# Patient Record
Sex: Female | Born: 1940 | Race: White | Hispanic: No | Marital: Married | Smoking: Former smoker
Health system: Southern US, Community
[De-identification: ages and names within clinical notes are randomized; demographics above are authoritative.]

## PROBLEM LIST (undated history)

## (undated) DIAGNOSIS — L659 Nonscarring hair loss, unspecified: Secondary | ICD-10-CM

## (undated) DIAGNOSIS — F419 Anxiety disorder, unspecified: Secondary | ICD-10-CM

## (undated) DIAGNOSIS — F329 Major depressive disorder, single episode, unspecified: Secondary | ICD-10-CM

## (undated) DIAGNOSIS — H9190 Unspecified hearing loss, unspecified ear: Secondary | ICD-10-CM

## (undated) DIAGNOSIS — E785 Hyperlipidemia, unspecified: Secondary | ICD-10-CM

## (undated) DIAGNOSIS — I10 Essential (primary) hypertension: Secondary | ICD-10-CM

## (undated) DIAGNOSIS — J45909 Unspecified asthma, uncomplicated: Secondary | ICD-10-CM

## (undated) DIAGNOSIS — R251 Tremor, unspecified: Secondary | ICD-10-CM

## (undated) DIAGNOSIS — M109 Gout, unspecified: Secondary | ICD-10-CM

## (undated) DIAGNOSIS — D259 Leiomyoma of uterus, unspecified: Secondary | ICD-10-CM

## (undated) DIAGNOSIS — K5900 Constipation, unspecified: Secondary | ICD-10-CM

## (undated) DIAGNOSIS — K219 Gastro-esophageal reflux disease without esophagitis: Secondary | ICD-10-CM

## (undated) DIAGNOSIS — F32A Depression, unspecified: Secondary | ICD-10-CM

## (undated) DIAGNOSIS — N819 Female genital prolapse, unspecified: Secondary | ICD-10-CM

## (undated) HISTORY — PX: TUBAL LIGATION: SHX77

## (undated) HISTORY — DX: Female genital prolapse, unspecified: N81.9

## (undated) HISTORY — DX: Unspecified hearing loss, unspecified ear: H91.90

## (undated) HISTORY — DX: Leiomyoma of uterus, unspecified: D25.9

## (undated) HISTORY — DX: Tremor, unspecified: R25.1

## (undated) HISTORY — PX: TONSILLECTOMY: SUR1361

## (undated) HISTORY — DX: Gout, unspecified: M10.9

## (undated) HISTORY — DX: Hyperlipidemia, unspecified: E78.5

## (undated) HISTORY — DX: Constipation, unspecified: K59.00

## (undated) HISTORY — DX: Gastro-esophageal reflux disease without esophagitis: K21.9

## (undated) HISTORY — DX: Essential (primary) hypertension: I10

## (undated) HISTORY — DX: Nonscarring hair loss, unspecified: L65.9

## (undated) HISTORY — PX: WISDOM TOOTH EXTRACTION: SHX21

## (undated) HISTORY — DX: Anxiety disorder, unspecified: F41.9

---

## 1998-08-02 ENCOUNTER — Ambulatory Visit (HOSPITAL_COMMUNITY): Admission: RE | Admit: 1998-08-02 | Discharge: 1998-08-02 | Payer: Self-pay | Admitting: Gynecology

## 1999-08-05 ENCOUNTER — Other Ambulatory Visit: Admission: RE | Admit: 1999-08-05 | Discharge: 1999-08-05 | Payer: Self-pay | Admitting: Gynecology

## 2000-10-29 ENCOUNTER — Other Ambulatory Visit: Admission: RE | Admit: 2000-10-29 | Discharge: 2000-10-29 | Payer: Self-pay | Admitting: Gynecology

## 2002-01-13 ENCOUNTER — Other Ambulatory Visit: Admission: RE | Admit: 2002-01-13 | Discharge: 2002-01-13 | Payer: Self-pay | Admitting: Gynecology

## 2003-07-03 ENCOUNTER — Other Ambulatory Visit: Admission: RE | Admit: 2003-07-03 | Discharge: 2003-07-03 | Payer: Self-pay | Admitting: Gynecology

## 2004-07-08 ENCOUNTER — Other Ambulatory Visit: Admission: RE | Admit: 2004-07-08 | Discharge: 2004-07-08 | Payer: Self-pay | Admitting: Gynecology

## 2004-12-26 ENCOUNTER — Ambulatory Visit: Payer: Self-pay | Admitting: Gastroenterology

## 2005-01-03 ENCOUNTER — Ambulatory Visit: Payer: Self-pay | Admitting: Gastroenterology

## 2005-07-11 ENCOUNTER — Other Ambulatory Visit: Admission: RE | Admit: 2005-07-11 | Discharge: 2005-07-11 | Payer: Self-pay | Admitting: Gynecology

## 2007-02-01 ENCOUNTER — Other Ambulatory Visit: Admission: RE | Admit: 2007-02-01 | Discharge: 2007-02-01 | Payer: Self-pay | Admitting: Gynecology

## 2008-02-02 ENCOUNTER — Other Ambulatory Visit: Admission: RE | Admit: 2008-02-02 | Discharge: 2008-02-02 | Payer: Self-pay | Admitting: Gynecology

## 2010-02-28 DIAGNOSIS — G250 Essential tremor: Secondary | ICD-10-CM | POA: Insufficient documentation

## 2010-02-28 DIAGNOSIS — M810 Age-related osteoporosis without current pathological fracture: Secondary | ICD-10-CM | POA: Insufficient documentation

## 2010-02-28 DIAGNOSIS — K219 Gastro-esophageal reflux disease without esophagitis: Secondary | ICD-10-CM | POA: Insufficient documentation

## 2010-02-28 DIAGNOSIS — E785 Hyperlipidemia, unspecified: Secondary | ICD-10-CM | POA: Insufficient documentation

## 2010-02-28 DIAGNOSIS — F419 Anxiety disorder, unspecified: Secondary | ICD-10-CM | POA: Insufficient documentation

## 2010-02-28 DIAGNOSIS — J302 Other seasonal allergic rhinitis: Secondary | ICD-10-CM | POA: Insufficient documentation

## 2011-09-08 DIAGNOSIS — H9190 Unspecified hearing loss, unspecified ear: Secondary | ICD-10-CM | POA: Insufficient documentation

## 2012-09-23 DIAGNOSIS — R7301 Impaired fasting glucose: Secondary | ICD-10-CM | POA: Insufficient documentation

## 2015-10-05 DIAGNOSIS — M810 Age-related osteoporosis without current pathological fracture: Secondary | ICD-10-CM | POA: Diagnosis not present

## 2015-10-05 DIAGNOSIS — E784 Other hyperlipidemia: Secondary | ICD-10-CM | POA: Diagnosis not present

## 2015-10-05 DIAGNOSIS — E119 Type 2 diabetes mellitus without complications: Secondary | ICD-10-CM | POA: Diagnosis not present

## 2015-10-05 DIAGNOSIS — N390 Urinary tract infection, site not specified: Secondary | ICD-10-CM | POA: Diagnosis not present

## 2015-10-05 DIAGNOSIS — R8299 Other abnormal findings in urine: Secondary | ICD-10-CM | POA: Diagnosis not present

## 2015-10-10 DIAGNOSIS — H2512 Age-related nuclear cataract, left eye: Secondary | ICD-10-CM | POA: Diagnosis not present

## 2015-10-10 DIAGNOSIS — H25812 Combined forms of age-related cataract, left eye: Secondary | ICD-10-CM | POA: Diagnosis not present

## 2015-10-12 DIAGNOSIS — Z6824 Body mass index (BMI) 24.0-24.9, adult: Secondary | ICD-10-CM | POA: Diagnosis not present

## 2015-10-12 DIAGNOSIS — Z1389 Encounter for screening for other disorder: Secondary | ICD-10-CM | POA: Diagnosis not present

## 2015-10-12 DIAGNOSIS — D72829 Elevated white blood cell count, unspecified: Secondary | ICD-10-CM | POA: Diagnosis not present

## 2015-10-12 DIAGNOSIS — Z0000 Encounter for general adult medical examination without abnormal findings: Secondary | ICD-10-CM | POA: Diagnosis not present

## 2015-10-12 DIAGNOSIS — J45998 Other asthma: Secondary | ICD-10-CM | POA: Diagnosis not present

## 2015-10-12 DIAGNOSIS — M810 Age-related osteoporosis without current pathological fracture: Secondary | ICD-10-CM | POA: Diagnosis not present

## 2015-10-12 DIAGNOSIS — F418 Other specified anxiety disorders: Secondary | ICD-10-CM | POA: Diagnosis not present

## 2015-10-12 DIAGNOSIS — E784 Other hyperlipidemia: Secondary | ICD-10-CM | POA: Diagnosis not present

## 2015-10-12 DIAGNOSIS — E119 Type 2 diabetes mellitus without complications: Secondary | ICD-10-CM | POA: Diagnosis not present

## 2015-10-19 DIAGNOSIS — Z1212 Encounter for screening for malignant neoplasm of rectum: Secondary | ICD-10-CM | POA: Diagnosis not present

## 2016-01-04 ENCOUNTER — Encounter (HOSPITAL_COMMUNITY): Payer: Self-pay | Admitting: Emergency Medicine

## 2016-01-04 ENCOUNTER — Emergency Department (HOSPITAL_COMMUNITY): Payer: Medicare Other

## 2016-01-04 ENCOUNTER — Emergency Department (HOSPITAL_COMMUNITY)
Admission: EM | Admit: 2016-01-04 | Discharge: 2016-01-04 | Disposition: A | Discharge status: Home / Self Care | Payer: Medicare Other | Attending: Emergency Medicine | Admitting: Emergency Medicine

## 2016-01-04 DIAGNOSIS — Y9301 Activity, walking, marching and hiking: Secondary | ICD-10-CM | POA: Diagnosis not present

## 2016-01-04 DIAGNOSIS — Y999 Unspecified external cause status: Secondary | ICD-10-CM | POA: Diagnosis not present

## 2016-01-04 DIAGNOSIS — S99922A Unspecified injury of left foot, initial encounter: Secondary | ICD-10-CM | POA: Diagnosis not present

## 2016-01-04 DIAGNOSIS — S91312A Laceration without foreign body, left foot, initial encounter: Secondary | ICD-10-CM | POA: Diagnosis not present

## 2016-01-04 DIAGNOSIS — J45909 Unspecified asthma, uncomplicated: Secondary | ICD-10-CM | POA: Insufficient documentation

## 2016-01-04 DIAGNOSIS — IMO0002 Reserved for concepts with insufficient information to code with codable children: Secondary | ICD-10-CM

## 2016-01-04 DIAGNOSIS — S90812A Abrasion, left foot, initial encounter: Secondary | ICD-10-CM | POA: Diagnosis present

## 2016-01-04 DIAGNOSIS — W109XXA Fall (on) (from) unspecified stairs and steps, initial encounter: Secondary | ICD-10-CM | POA: Insufficient documentation

## 2016-01-04 DIAGNOSIS — Y929 Unspecified place or not applicable: Secondary | ICD-10-CM | POA: Diagnosis not present

## 2016-01-04 HISTORY — DX: Unspecified asthma, uncomplicated: J45.909

## 2016-01-04 MED ORDER — LIDOCAINE-EPINEPHRINE (PF) 2 %-1:200000 IJ SOLN
20.0000 mL | Freq: Once | INTRAMUSCULAR | Status: AC
  Administered 2016-01-04: 20 mL
  Filled 2016-01-04: qty 20

## 2016-01-04 MED ORDER — HYDROCODONE-ACETAMINOPHEN 5-325 MG PO TABS
0.5000 | ORAL_TABLET | Freq: Once | ORAL | Status: AC
  Administered 2016-01-04: 0.5 via ORAL
  Filled 2016-01-04: qty 1

## 2016-01-04 MED ORDER — LIDOCAINE-EPINEPHRINE-TETRACAINE (LET) SOLUTION
3.0000 mL | Freq: Once | NASAL | Status: AC
  Administered 2016-01-04: 3 mL via TOPICAL
  Filled 2016-01-04: qty 3

## 2016-01-04 MED ORDER — NAPROXEN 500 MG PO TABS: 500.0000 mg | ORAL_TABLET | Freq: Two times a day (BID) | ORAL | Status: DC

## 2016-01-04 NOTE — Discharge Instructions (Signed)
You'll need to have sutures removed in 7-10 days. Keep wound clean and dry. May wash with soap and water. Apply antibiotic ointment daily and change your dressing. Please be careful while walking at do not want your sutures to repair. Return to the ED if you experience severe worsening of her symptoms, redness or swelling around her wound, fevers, chills, additional skin tear.

## 2016-01-04 NOTE — ED Notes (Signed)
Per patient she was walking out the steps, twisting her foot and scraping it across the concrete creating a skin tear. Patient denies striking her head, states that she does not take blood thinners. Patient states she was able to step on it and walk after falling. Foot is currently wrapped in a wet towel from home.

## 2016-01-04 NOTE — ED Notes (Signed)
Xylocaine at bedside for suturing.

## 2016-01-04 NOTE — ED Provider Notes (Signed)
CSN: [id number]     Arrival date & time 01/04/16  1820 History   By signing my name below, I, Marisue Humble, attest that this documentation has been prepared under the direction and in the presence of non-physician practitioner, Gaylyn Rong PA-C. Electronically Signed: Marisue Humble, Scribe. 01/04/2016. 7:42 PM.   Chief Complaint  Patient presents with  . Fall    left foot injury   The history is provided by the patient. No language interpreter was used.   HPI Comments:  Mariah King is a 75 y.o. female who presents to the Emergency Department complaining of painful abrasion to the top of her left foot s/p fall ~1.5 hours ago. Pt states she was walking down stairs, twisted her left foot, and scraped it across concrete. Pt was able to ambulate without difficulty after the fall. No alleviating factors noted or treatments attempted PTA. Bleeding controlled PTA. Last Tetanus ~5 years ago. Pt denies head injury or syncope.  Past Medical History  Diagnosis Date  . Asthma    Past Surgical History  Procedure Laterality Date  . Tubal ligation    . Tonsillectomy     History reviewed. No pertinent family history. Social History  Substance Use Topics  . Smoking status: Never Smoker   . Smokeless tobacco: None  . Alcohol Use: No   OB History    No data available     Review of Systems  Skin: Positive for wound.  Neurological: Negative for syncope.  All other systems reviewed and are negative.  Allergies  Sulfa antibiotics  Home Medications   Prior to Admission medications   Not on File   BP 137/66 mmHg  Pulse 68  Temp(Src) 98 F (36.7 C) (Oral)  Resp 18  Ht  (1.626 m)  Wt 148 lb (67.132 kg)  BMI 25.39 kg/m2  SpO2 96% Physical Exam  Constitutional: She is oriented to person, place, and time. She appears well-developed and well-nourished. No distress.  HENT:  Head: Normocephalic and atraumatic.  Eyes: Conjunctivae are normal. Right eye exhibits no discharge.  Left eye exhibits no discharge. No scleral icterus.  Cardiovascular: Normal rate.   Pulmonary/Chest: Effort normal.  Musculoskeletal:  No decrease ROM of left ankle. No obvious bony deformity.  Neurological: She is alert and oriented to person, place, and time. Coordination normal.  Skin: Skin is warm and dry. No rash noted. She is not diaphoretic. No erythema. No pallor.  7cm laceration to dorsum of left foot with fascia layer exposure. No evidence of tendon injury. No foreign bodies seen or palpated.   Psychiatric: She has a normal mood and affect. Her behavior is normal.  Nursing note and vitals reviewed.   ED Course  Procedures  DIAGNOSTIC STUDIES:  Oxygen Saturation is 96% on RA, normal by my interpretation.    COORDINATION OF CARE:  7:39 PM Will order x-ray of left foot and administer pain medication. Discussed treatment plan with pt at bedside and pt agreed to plan.  LACERATION REPAIR Performed by: Gaylyn Rong PA-C Consent: Verbal consent obtained. Risks and benefits: risks, benefits and alternatives were discussed Patient identity confirmed: provided demographic data Time out performed prior to procedure Prepped and Draped in normal sterile fashion Wound explored Laceration Location: dorsum left foot Laceration Length: 7cm No Foreign Bodies seen or palpated Anesthesia: local infiltration Local anesthetic: lidocaine 2% epinephrine Anesthetic total: 10 ml Irrigation method: syringe Amount of cleaning: standard Skin closure: approximated Number of sutures or staples: 14 Technique: simple interrupted Patient  tolerance: Patient tolerated the procedure well with no immediate complications.   Labs Review Labs Reviewed - No data to display  Imaging Review Dg Foot Complete Left  01/04/2016  CLINICAL DATA:  Larey SeatFell 1-1/2 hours ago. EXAM: LEFT FOOT - COMPLETE 3+ VIEW COMPARISON:  None. FINDINGS: Negative for acute fracture, dislocation or radiopaque foreign body. No  acute soft tissue abnormality. IMPRESSION: Negative. Electronically Signed   By: Ellery Plunkaniel R Mitchell M.D.   On: 01/04/2016 20:05   I have personally reviewed and evaluated these images and lab results as part of my medical decision-making.   EKG Interpretation None      MDM   Final diagnoses:  Laceration   Tetanus UTD. Laceration occurred < 12 hours prior to repair. Discussed laceration care with pt and answered questions. Pt to f-u for suture removal in 7-10 days and wound check sooner should there be signs of dehiscence or infection. Pt is hemodynamically stable with no complaints prior to dc.      I personally performed the services described in this documentation, which was scribed in my presence. The recorded information has been reviewed and is accurate.     Lester KinsmanSamantha Tripp HardinDowless, PA-C 01/04/16 2148  Lorre NickAnthony Allen, MD 01/13/16 [phone]

## 2016-01-04 NOTE — ED Notes (Signed)
dermabond given to PA.

## 2016-01-04 NOTE — ED Notes (Signed)
PA at bedside for suturing 

## 2016-01-08 DIAGNOSIS — Z5189 Encounter for other specified aftercare: Secondary | ICD-10-CM | POA: Diagnosis not present

## 2016-01-08 DIAGNOSIS — S91312A Laceration without foreign body, left foot, initial encounter: Secondary | ICD-10-CM | POA: Diagnosis not present

## 2016-01-15 DIAGNOSIS — E119 Type 2 diabetes mellitus without complications: Secondary | ICD-10-CM | POA: Diagnosis not present

## 2016-01-15 DIAGNOSIS — Z6825 Body mass index (BMI) 25.0-25.9, adult: Secondary | ICD-10-CM | POA: Diagnosis not present

## 2016-01-15 DIAGNOSIS — Z4802 Encounter for removal of sutures: Secondary | ICD-10-CM | POA: Diagnosis not present

## 2016-01-15 DIAGNOSIS — Z5189 Encounter for other specified aftercare: Secondary | ICD-10-CM | POA: Diagnosis not present

## 2016-01-18 DIAGNOSIS — Z6824 Body mass index (BMI) 24.0-24.9, adult: Secondary | ICD-10-CM | POA: Diagnosis not present

## 2016-01-18 DIAGNOSIS — Z4802 Encounter for removal of sutures: Secondary | ICD-10-CM | POA: Diagnosis not present

## 2016-01-18 DIAGNOSIS — Z5189 Encounter for other specified aftercare: Secondary | ICD-10-CM | POA: Diagnosis not present

## 2016-01-31 DIAGNOSIS — L0889 Other specified local infections of the skin and subcutaneous tissue: Secondary | ICD-10-CM | POA: Diagnosis not present

## 2016-01-31 DIAGNOSIS — Z6824 Body mass index (BMI) 24.0-24.9, adult: Secondary | ICD-10-CM | POA: Diagnosis not present

## 2016-02-25 ENCOUNTER — Encounter (HOSPITAL_BASED_OUTPATIENT_CLINIC_OR_DEPARTMENT_OTHER): Payer: Medicare Other | Attending: Internal Medicine

## 2016-02-25 DIAGNOSIS — Z87891 Personal history of nicotine dependence: Secondary | ICD-10-CM | POA: Insufficient documentation

## 2016-02-25 DIAGNOSIS — L97521 Non-pressure chronic ulcer of other part of left foot limited to breakdown of skin: Secondary | ICD-10-CM | POA: Insufficient documentation

## 2016-02-25 DIAGNOSIS — E11621 Type 2 diabetes mellitus with foot ulcer: Secondary | ICD-10-CM | POA: Diagnosis not present

## 2016-02-25 DIAGNOSIS — S90812A Abrasion, left foot, initial encounter: Secondary | ICD-10-CM | POA: Diagnosis not present

## 2016-03-03 ENCOUNTER — Encounter (HOSPITAL_BASED_OUTPATIENT_CLINIC_OR_DEPARTMENT_OTHER): Payer: Medicare Other | Attending: Internal Medicine

## 2016-03-03 DIAGNOSIS — L97521 Non-pressure chronic ulcer of other part of left foot limited to breakdown of skin: Secondary | ICD-10-CM | POA: Diagnosis not present

## 2016-03-03 DIAGNOSIS — E11621 Type 2 diabetes mellitus with foot ulcer: Secondary | ICD-10-CM | POA: Insufficient documentation

## 2016-03-03 DIAGNOSIS — E1142 Type 2 diabetes mellitus with diabetic polyneuropathy: Secondary | ICD-10-CM | POA: Diagnosis not present

## 2016-03-10 DIAGNOSIS — E11621 Type 2 diabetes mellitus with foot ulcer: Secondary | ICD-10-CM | POA: Diagnosis not present

## 2016-03-10 DIAGNOSIS — L97521 Non-pressure chronic ulcer of other part of left foot limited to breakdown of skin: Secondary | ICD-10-CM | POA: Diagnosis not present

## 2016-03-17 DIAGNOSIS — E11621 Type 2 diabetes mellitus with foot ulcer: Secondary | ICD-10-CM | POA: Diagnosis not present

## 2016-03-17 DIAGNOSIS — L97521 Non-pressure chronic ulcer of other part of left foot limited to breakdown of skin: Secondary | ICD-10-CM | POA: Diagnosis not present

## 2016-03-24 DIAGNOSIS — L97521 Non-pressure chronic ulcer of other part of left foot limited to breakdown of skin: Secondary | ICD-10-CM | POA: Diagnosis not present

## 2016-03-24 DIAGNOSIS — E11621 Type 2 diabetes mellitus with foot ulcer: Secondary | ICD-10-CM | POA: Diagnosis not present

## 2016-03-31 DIAGNOSIS — L97521 Non-pressure chronic ulcer of other part of left foot limited to breakdown of skin: Secondary | ICD-10-CM | POA: Diagnosis not present

## 2016-03-31 DIAGNOSIS — E11621 Type 2 diabetes mellitus with foot ulcer: Secondary | ICD-10-CM | POA: Diagnosis not present

## 2016-04-07 ENCOUNTER — Encounter (HOSPITAL_BASED_OUTPATIENT_CLINIC_OR_DEPARTMENT_OTHER): Payer: Medicare Other | Attending: Internal Medicine

## 2016-04-07 DIAGNOSIS — E11621 Type 2 diabetes mellitus with foot ulcer: Secondary | ICD-10-CM | POA: Diagnosis not present

## 2016-04-07 DIAGNOSIS — L97821 Non-pressure chronic ulcer of other part of left lower leg limited to breakdown of skin: Secondary | ICD-10-CM | POA: Insufficient documentation

## 2016-04-07 DIAGNOSIS — X35XXXA Volcanic eruption, initial encounter: Secondary | ICD-10-CM | POA: Diagnosis not present

## 2016-05-15 DIAGNOSIS — H2511 Age-related nuclear cataract, right eye: Secondary | ICD-10-CM | POA: Diagnosis not present

## 2016-05-19 DIAGNOSIS — Z23 Encounter for immunization: Secondary | ICD-10-CM | POA: Diagnosis not present

## 2016-10-08 DIAGNOSIS — E119 Type 2 diabetes mellitus without complications: Secondary | ICD-10-CM | POA: Diagnosis not present

## 2016-10-08 DIAGNOSIS — D72829 Elevated white blood cell count, unspecified: Secondary | ICD-10-CM | POA: Diagnosis not present

## 2016-10-08 DIAGNOSIS — E784 Other hyperlipidemia: Secondary | ICD-10-CM | POA: Diagnosis not present

## 2016-10-08 DIAGNOSIS — M810 Age-related osteoporosis without current pathological fracture: Secondary | ICD-10-CM | POA: Diagnosis not present

## 2016-10-08 DIAGNOSIS — Z0000 Encounter for general adult medical examination without abnormal findings: Secondary | ICD-10-CM | POA: Diagnosis not present

## 2016-10-16 DIAGNOSIS — D72829 Elevated white blood cell count, unspecified: Secondary | ICD-10-CM | POA: Diagnosis not present

## 2016-10-16 DIAGNOSIS — Z0000 Encounter for general adult medical examination without abnormal findings: Secondary | ICD-10-CM | POA: Diagnosis not present

## 2016-10-16 DIAGNOSIS — J45998 Other asthma: Secondary | ICD-10-CM | POA: Diagnosis not present

## 2016-10-16 DIAGNOSIS — E119 Type 2 diabetes mellitus without complications: Secondary | ICD-10-CM | POA: Diagnosis not present

## 2016-10-21 DIAGNOSIS — Z1212 Encounter for screening for malignant neoplasm of rectum: Secondary | ICD-10-CM | POA: Diagnosis not present

## 2016-10-23 DIAGNOSIS — M810 Age-related osteoporosis without current pathological fracture: Secondary | ICD-10-CM | POA: Diagnosis not present

## 2016-12-18 ENCOUNTER — Ambulatory Visit (INDEPENDENT_AMBULATORY_CARE_PROVIDER_SITE_OTHER): Payer: Medicare Other | Admitting: Physician Assistant

## 2016-12-18 ENCOUNTER — Ambulatory Visit (INDEPENDENT_AMBULATORY_CARE_PROVIDER_SITE_OTHER): Payer: Medicare Other

## 2016-12-18 VITALS — BP 130/81 | HR 92 | Temp 97.4°F | Resp 18 | Ht 64.0 in | Wt 143.0 lb

## 2016-12-18 DIAGNOSIS — S81811A Laceration without foreign body, right lower leg, initial encounter: Secondary | ICD-10-CM | POA: Diagnosis not present

## 2016-12-18 DIAGNOSIS — M79661 Pain in right lower leg: Secondary | ICD-10-CM

## 2016-12-18 NOTE — Patient Instructions (Addendum)
WOUND CARE Please return in 7 days to have your wound reevaluated or sooner if you have concerns. Marland Kitchen Keep area clean and dry for 24 hours. Do not remove bandage, if applied. . After 24 hours, you can wash in the shower gently with mild soap and warm water. Reapply a new bandage after cleaning wound, if directed. . Continue daily cleansing with soap and water until Steri strips are removed . Do not apply any ointments or creams to the wound while steri strips are in place, as this may cause delayed healing. . Notify the office if you experience any of the following signs of infection: Swelling, redness, pus drainage, streaking, fever >101.0 F . Notify the office if you experience excessive bleeding that does not stop after 15-20 minutes of constant, firm pressure.     IF you received an x-ray today, you will receive an invoice from [facility]. Please contact [facility] at [phone] with questions or concerns regarding your invoice.   IF you received labwork today, you will receive an invoice from [facility]. Please contact [facility] at [phone] with questions or concerns regarding your invoice.   Our billing staff will not be able to assist you with questions regarding bills from these companies.  You will be contacted with the lab results as soon as they are available. The fastest way to get your results is to activate your My Chart account. Instructions are located on the last page of this paperwork. If you have not heard from [place] regarding the results in 2 weeks, please contact this office.

## 2016-12-18 NOTE — Progress Notes (Signed)
Mariah King  MRN: [id number] DOB: 1940-12-19  Subjective:  Mariah King is a 76 y.o. female seen in office today for a chief complaint of cut to right lower leg. Pt fell from a chair while trying to retireve her blender from a cabinet. She landed on her bottom. She manged to cut her lower leg with the fall and had immediately bleeding. This occurred an hour before visit and she notes she cannot get the bleeding to stop. She is also having some pain surround the cut. She is not on a blood thinner but does take aspirin daily. She is up to date on tetanus. Of note, pt denies hitting her head, LOC, nausea, vomiting, decreased ROM, numbness, tingling, and weakness.   Review of Systems  Constitutional: Negative for chills, diaphoresis and fever.  Musculoskeletal: Negative for back pain and gait problem.  Neurological: Negative for syncope and headaches.  Psychiatric/Behavioral: Negative for confusion and decreased concentration.   There are no active problems to display for this patient.   Current Outpatient Prescriptions on File Prior to Visit  Medication Sig Dispense Refill  . naproxen (NAPROSYN) 500 MG tablet Take 1 tablet (500 mg total) by mouth 2 (two) times daily. (Patient not taking: Reported on 12/18/2016) 30 tablet 0   No current facility-administered medications on file prior to visit.     Allergies  Allergen Reactions  . Sulfa Antibiotics Other (See Comments)    Childhood, unknown reaction     Objective:  BP 130/81   Pulse 92   Temp 97.4 F (36.3 C) (Oral)   Resp 18   Ht  (1.626 m)   Wt 143 lb (64.9 kg)   SpO2 93%   BMI 24.55 kg/m   Physical Exam  Constitutional: She is oriented to person, place, and time and well-developed, well-nourished, and in no distress.  HENT:  Head: Normocephalic and atraumatic.  Eyes: Conjunctivae are normal.  Neck: Normal range of motion.  Pulmonary/Chest: Effort normal.  Musculoskeletal:       Right ankle: Normal. No  tenderness.       Left ankle: Normal. No tenderness.  Neurological: She is alert and oriented to person, place, and time. Gait normal.  Skin: Skin is warm and dry.  Disruption in the superfical skin deeper towards medial aspect of flap consistent with skin tear of anterior aspect of right lower extremity. Bleeding noted. Mild ecchymosis surrounding tear. Tenderness with palpation surrounding skin tear.   Psychiatric: Affect normal.  Vitals reviewed.  Dg Tibia/fibula Right  Result Date: 12/18/2016 CLINICAL DATA:  Right anterior lower leg pain and laceration after falling off a chair. EXAM: RIGHT TIBIA AND FIBULA - 2 VIEW COMPARISON:  None. FINDINGS: Anterior patellar spur formation. No fracture, dislocation or radiopaque foreign body. IMPRESSION: No fracture. Electronically Signed   By: Beckie Salts M.D.   On: 12/18/2016 12:03   PROCEDURE NOTE: Skin tear repair Verbal consent obtained from patient.  Local anesthesia with 5cc Lidocaine 1% with epinephrine.  Wound explored for tendon, ligament damage. Wound scrubbed with soap and water and rinsed. Wound closed with steri strips. Wound cleansed and dressed.  Assessment and Plan :  1. Noninfected skin tear of right lower extremity, initial encounter Wound care instructions given to patient. Return in 7 days for further evaluation, would ideally like steri strips to stay in place for at least 2 weeks. Instructed to return to office if she develops any cNotify the office if you experience any swelling, redness, pus  drainage, streaking, fever >101.0 F, or excessive bleeding that does not stop after 15-20 minutes of constant, firm pressure. 2. Pain of right lower leg - DG Tibia/Fibula Right; Future  Benjiman Core PA-C  [facility] 12/18/2016 12:12 PM

## 2016-12-24 ENCOUNTER — Ambulatory Visit (INDEPENDENT_AMBULATORY_CARE_PROVIDER_SITE_OTHER): Payer: Medicare Other | Admitting: Physician Assistant

## 2016-12-24 ENCOUNTER — Encounter: Payer: Self-pay | Admitting: Physician Assistant

## 2016-12-24 VITALS — BP 132/82 | HR 88 | Temp 97.6°F | Resp 16 | Ht 64.0 in | Wt 143.4 lb

## 2016-12-24 DIAGNOSIS — S81811D Laceration without foreign body, right lower leg, subsequent encounter: Secondary | ICD-10-CM

## 2016-12-24 NOTE — Progress Notes (Signed)
   Patient: Mariah King [id number]  Subjective: Mariah King is returning for follow up on skin tear of right lower leg. Patient was initially seen on 12/18/16 and had steri strips  placed. She has been keeping it covered in the shower and changing the outer bandage when it becomes soaked. Denies fever, drainage of pus or blood, wound dehiscence, edema, and pain.   Objective: Physical Exam  Constitutional: She is oriented to person, place, and time and well-developed, well-nourished, and in no distress.  HENT:  Head: Normocephalic and atraumatic.  Eyes: Conjunctivae are normal.  Neck: Normal range of motion.  Pulmonary/Chest: Effort normal.  Neurological: She is alert and oriented to person, place, and time. Gait normal.  Skin: Skin is warm and dry.  Well healing wound as depicted in the image below. Steri strips intact. There is mild ecchymosis surrounding the healing wound. No tenderness or warmth to palpation.   Psychiatric: Affect normal.  Vitals reviewed.      Loose steri strips were trimmed. Wound dressing replaced.  Assessment and Plan: 1. Noninfected skin tear of right lower extremity, subsequent encounter Well-healing wound. Anticipatory guidance and wound care instructions provided. Return to clinic in 7 days for further reevaluation.   Benjiman Core, PA-C  [facility] 12/24/2016 1:07 PM

## 2016-12-24 NOTE — Patient Instructions (Addendum)
  For the wound, please keep covered for the next 3 days while showering, change the bandage after you shower. After three days you can start washing it without a bandage over it. Just let water and soap run over it gently, do not scrub. If the steri strips start to come off just trim them to the sticky part. Do not pull them off. Follow up in one week for reevaluation. If you develop any fever, chills, bleeding, pus drainage, redness, warmth, or pain, seek care sooner. Thank you for letting me participate in your health and well being.   IF you received an x-ray today, you will receive an invoice from [facility]. Please contact [facility] at [phone] with questions or concerns regarding your invoice.   IF you received labwork today, you will receive an invoice from [facility]. Please contact [facility] at [phone] with questions or concerns regarding your invoice.   Our billing staff will not be able to assist you with questions regarding bills from these companies.  You will be contacted with the lab results as soon as they are available. The fastest way to get your results is to activate your My Chart account. Instructions are located on the last page of this paperwork. If you have not heard from [place] regarding the results in 2 weeks, please contact this office.

## 2016-12-31 ENCOUNTER — Encounter: Payer: Self-pay | Admitting: Physician Assistant

## 2016-12-31 ENCOUNTER — Ambulatory Visit (INDEPENDENT_AMBULATORY_CARE_PROVIDER_SITE_OTHER): Payer: Medicare Other | Admitting: Physician Assistant

## 2016-12-31 VITALS — BP 127/67 | HR 75 | Temp 98.1°F | Resp 18 | Ht 64.37 in | Wt 142.0 lb

## 2016-12-31 DIAGNOSIS — S81811D Laceration without foreign body, right lower leg, subsequent encounter: Secondary | ICD-10-CM | POA: Diagnosis not present

## 2016-12-31 NOTE — Patient Instructions (Signed)
Your wound is healing well. Continue to only wash the wound gently. Do not apply ointment. This will take a few more weeks to fully heal. Please return if you develop any signs of infection like pus, fever, chills, redness, and warmth. Thank you for letting me participate in your health and well being.

## 2016-12-31 NOTE — Progress Notes (Signed)
   Patient: Mariah King [id number]  Subjective: Mariah King is returning for follow up on skin tear.  Patient was initially seen on 12/18/16 and had steri strips placed. Since her last visit one week ago, the steri strips have fallen off. She has continued to gently wash it in the shower. Has not applied any ointments.  Denies fever, drainage of pus or blood, wound dehiscence, edema, pain, redness or warmth.   Objective: Physical Exam  Constitutional: She is oriented to person, place, and time and well-developed, well-nourished, and in no distress.  HENT:  Head: Normocephalic and atraumatic.  Eyes: Conjunctivae are normal.  Neck: Normal range of motion.  Pulmonary/Chest: Effort normal.  Neurological: She is alert and oriented to person, place, and time. Gait normal.  Skin: Skin is warm and dry.  Well healing wound noted on right lower extremity. Eschar has formed. No purulent drainage noted. No warmth or tenderness palpated.   Psychiatric: Affect normal.  Vitals reviewed.       Assessment and Plan: 1. Noninfected skin tear of right lower extremity, subsequent encounter Well-healed wound. Anticipatory guidance provided. Return to clinic as needed.  Benjiman Core, PA-C  [facility] 12/31/2016 3:44 PM

## 2017-01-31 ENCOUNTER — Encounter (HOSPITAL_COMMUNITY): Payer: Self-pay

## 2017-01-31 ENCOUNTER — Inpatient Hospital Stay (HOSPITAL_COMMUNITY)
Admission: EM | Admit: 2017-01-31 | Discharge: 2017-02-03 | DRG: 872 | Disposition: A | Discharge status: Home Health | Payer: Medicare Other | Attending: Internal Medicine | Admitting: Internal Medicine

## 2017-01-31 ENCOUNTER — Emergency Department (HOSPITAL_COMMUNITY): Payer: Medicare Other

## 2017-01-31 DIAGNOSIS — F419 Anxiety disorder, unspecified: Secondary | ICD-10-CM | POA: Diagnosis not present

## 2017-01-31 DIAGNOSIS — J45909 Unspecified asthma, uncomplicated: Secondary | ICD-10-CM | POA: Diagnosis not present

## 2017-01-31 DIAGNOSIS — F329 Major depressive disorder, single episode, unspecified: Secondary | ICD-10-CM | POA: Diagnosis present

## 2017-01-31 DIAGNOSIS — R109 Unspecified abdominal pain: Secondary | ICD-10-CM

## 2017-01-31 DIAGNOSIS — E860 Dehydration: Secondary | ICD-10-CM | POA: Diagnosis not present

## 2017-01-31 DIAGNOSIS — N179 Acute kidney failure, unspecified: Secondary | ICD-10-CM | POA: Diagnosis not present

## 2017-01-31 DIAGNOSIS — R112 Nausea with vomiting, unspecified: Secondary | ICD-10-CM | POA: Diagnosis not present

## 2017-01-31 DIAGNOSIS — R197 Diarrhea, unspecified: Secondary | ICD-10-CM | POA: Diagnosis not present

## 2017-01-31 DIAGNOSIS — E876 Hypokalemia: Secondary | ICD-10-CM | POA: Diagnosis present

## 2017-01-31 DIAGNOSIS — A084 Viral intestinal infection, unspecified: Secondary | ICD-10-CM | POA: Diagnosis not present

## 2017-01-31 DIAGNOSIS — F32A Depression, unspecified: Secondary | ICD-10-CM | POA: Diagnosis present

## 2017-01-31 DIAGNOSIS — R1084 Generalized abdominal pain: Secondary | ICD-10-CM | POA: Diagnosis not present

## 2017-01-31 DIAGNOSIS — R1110 Vomiting, unspecified: Secondary | ICD-10-CM | POA: Diagnosis not present

## 2017-01-31 DIAGNOSIS — R1030 Lower abdominal pain, unspecified: Secondary | ICD-10-CM | POA: Diagnosis present

## 2017-01-31 DIAGNOSIS — R000 Tachycardia, unspecified: Secondary | ICD-10-CM | POA: Diagnosis not present

## 2017-01-31 DIAGNOSIS — A419 Sepsis, unspecified organism: Principal | ICD-10-CM | POA: Diagnosis present

## 2017-01-31 HISTORY — DX: Depression, unspecified: F32.A

## 2017-01-31 HISTORY — DX: Major depressive disorder, single episode, unspecified: F32.9

## 2017-01-31 LAB — CBC
HCT: 44.7 % (ref 36.0–46.0)
Hemoglobin: 15 g/dL (ref 12.0–15.0)
MCH: 29.3 pg (ref 26.0–34.0)
MCHC: 33.6 g/dL (ref 30.0–36.0)
MCV: 87.3 fL (ref 78.0–100.0)
Platelets: 341 10*3/uL (ref 150–400)
RBC: 5.12 MIL/uL — ABNORMAL HIGH (ref 3.87–5.11)
RDW: 14.4 % (ref 11.5–15.5)
WBC: 20.9 10*3/uL — ABNORMAL HIGH (ref 4.0–10.5)

## 2017-01-31 LAB — COMPREHENSIVE METABOLIC PANEL
ALBUMIN: 3.7 g/dL (ref 3.5–5.0)
ALK PHOS: 105 U/L (ref 38–126)
ALT: 14 U/L (ref 14–54)
AST: 27 U/L (ref 15–41)
Anion gap: 18 — ABNORMAL HIGH (ref 5–15)
BILIRUBIN TOTAL: 0.7 mg/dL (ref 0.3–1.2)
BUN: 22 mg/dL — AB (ref 6–20)
CALCIUM: 9.2 mg/dL (ref 8.9–10.3)
CO2: 21 mmol/L — ABNORMAL LOW (ref 22–32)
Chloride: 103 mmol/L (ref 101–111)
Creatinine, Ser: 1.21 mg/dL — ABNORMAL HIGH (ref 0.44–1.00)
GFR calc Af Amer: 49 mL/min — ABNORMAL LOW (ref >60)
GFR calc non Af Amer: 43 mL/min — ABNORMAL LOW (ref >60)
Glucose, Bld: 220 mg/dL — ABNORMAL HIGH (ref 65–99)
Potassium: 3.8 mmol/L (ref 3.5–5.1)
Sodium: 142 mmol/L (ref 135–145)
TOTAL PROTEIN: 7.6 g/dL (ref 6.5–8.1)

## 2017-01-31 LAB — LIPASE, BLOOD: Lipase: 28 U/L (ref 11–51)

## 2017-01-31 LAB — I-STAT CG4 LACTIC ACID, ED: Lactic Acid, Venous: 5.64 mmol/L (ref 0.5–1.9)

## 2017-01-31 MED ORDER — PIPERACILLIN-TAZOBACTAM 3.375 G IVPB 30 MIN
3.3750 g | INTRAVENOUS | Status: AC
  Administered 2017-01-31: 3.375 g via INTRAVENOUS
  Filled 2017-01-31: qty 50

## 2017-01-31 MED ORDER — VANCOMYCIN HCL IN DEXTROSE 1-5 GM/200ML-% IV SOLN: 1000.0000 mg | INTRAVENOUS | Status: DC

## 2017-01-31 MED ORDER — ACETAMINOPHEN 500 MG PO TABS
1000.0000 mg | ORAL_TABLET | Freq: Once | ORAL | Status: AC
  Administered 2017-01-31: 1000 mg via ORAL
  Filled 2017-01-31: qty 2

## 2017-01-31 MED ORDER — SODIUM CHLORIDE 0.9 % IV BOLUS (SEPSIS)
1000.0000 mL | Freq: Once | INTRAVENOUS | Status: AC
  Administered 2017-01-31: 1000 mL via INTRAVENOUS

## 2017-01-31 MED ORDER — PIPERACILLIN-TAZOBACTAM 3.375 G IVPB
3.3750 g | Freq: Three times a day (TID) | INTRAVENOUS | Status: DC
  Administered 2017-02-01 – 2017-02-02 (×4): 3.375 g via INTRAVENOUS
  Filled 2017-01-31 (×6): qty 50

## 2017-01-31 MED ORDER — ONDANSETRON HCL 4 MG/2ML IJ SOLN
4.0000 mg | Freq: Once | INTRAMUSCULAR | Status: AC | PRN
  Administered 2017-01-31: 4 mg via INTRAVENOUS
  Filled 2017-01-31: qty 2

## 2017-01-31 MED ORDER — IOPAMIDOL (ISOVUE-300) INJECTION 61%
80.0000 mL | Freq: Once | INTRAVENOUS | Status: AC | PRN
  Administered 2017-01-31: 80 mL via INTRAVENOUS

## 2017-01-31 MED ORDER — VANCOMYCIN HCL IN DEXTROSE 1-5 GM/200ML-% IV SOLN
1000.0000 mg | Freq: Once | INTRAVENOUS | Status: AC
  Administered 2017-01-31: 1000 mg via INTRAVENOUS
  Filled 2017-01-31: qty 200

## 2017-01-31 MED ORDER — IOPAMIDOL (ISOVUE-300) INJECTION 61%
INTRAVENOUS | Status: AC
  Filled 2017-01-31: qty 100

## 2017-01-31 NOTE — ED Triage Notes (Signed)
Pt arrived via EMS from home c/o N/V/D x 1 day at 6pm, and is chills. Per EMS pt has been having been dry heaving. EMS gave pt 4mg  PO  of Zofran.   20 G. L AC BP

## 2017-01-31 NOTE — ED Provider Notes (Signed)
[facility]-EMERGENCY DEPT Provider Note   CSN: [id number] Arrival date & time: 01/31/17  2153     History   Chief Complaint Chief Complaint  Patient presents with  . Nausea  . Emesis  . Diarrhea    HPI Mariah King is a 76 y.o. female.  The history is provided by the patient and medical records.  Emesis   Associated symptoms include abdominal pain and diarrhea.  Diarrhea   Associated symptoms include abdominal pain and vomiting.    76 year old female with history of asthma, presenting to the ED for nausea, vomiting, diarrhea, and fever. She reports sudden onset of symptoms around 6 PM this evening. States she did eat eggs and sausage for breakfast, but did not eat much else during the day. States generally she does not eat foods like this. States food tasted normal. She is not had any sick contacts. She denies any significant abdominal pain, but reports her abdomen feels "full". States whenever she vomits she has diarrhea at the same time. Stool has been loose and watery. She has not had any melena or hematochezia. No hematemesis. Prior abdominal surgeries include tubal ligation. Patient is febrile, tachycardic, and tachypneic on arrival.  Past Medical History:  Diagnosis Date  . Asthma     There are no active problems to display for this patient.   Past Surgical History:  Procedure Laterality Date  . TONSILLECTOMY    . TUBAL LIGATION      OB History    No data available       Home Medications    Prior to Admission medications   Medication Sig Start Date End Date Taking? Authorizing Provider  aspirin 81 MG chewable tablet Chew by mouth daily.    [provider]  FLUoxetine (PROZAC) 40 MG capsule Take 40 mg by mouth daily.    [provider]  LORazepam (ATIVAN) 0.5 MG tablet Take 0.5 mg by mouth 2 (two) times daily.    [provider]  mirtazapine (REMERON) 15 MG tablet Take 15 mg by mouth at bedtime.    [provider]  mometasone  [facility]) 220 MCG/INH inhaler Inhale 2 puffs into the lungs 2 (two) times daily.    [provider]  montelukast (SINGULAIR) 10 MG tablet Take 10 mg by mouth AC breakfast.    [provider]  Vitamin D, Ergocalciferol, (DRISDOL) 50000 units CAPS capsule Take 50,000 Units by mouth every 7 (seven) days.    [provider]    Family History History reviewed. No pertinent family history.  Social History Social History  Substance Use Topics  . Smoking status: Never Smoker  . Smokeless tobacco: Never Used  . Alcohol use No     Allergies   Sulfa antibiotics   Review of Systems Review of Systems  Gastrointestinal: Positive for abdominal pain, diarrhea, nausea and vomiting.  All other systems reviewed and are negative.    Physical Exam Updated Vital Signs BP (!) 131/102 (BP Location: Right Arm)   Pulse (!) 137   Temp (!) 102.9 F (39.4 C) (Rectal)   Resp (!) 26   Ht 5\' 4"  (1.626 m)   Wt 64.9 kg (143 lb)   SpO2 95%   BMI 24.55 kg/m   Physical Exam  Constitutional: She is oriented to person, place, and time. She appears well-developed and well-nourished. She appears ill.  Elderly, appears unwell but non-toxic, visible rigors during exam  HENT:  Head: Normocephalic and atraumatic.  Mouth/Throat: Oropharynx is clear and  moist.  Eyes: Conjunctivae and EOM are normal. Pupils are equal, round, and reactive to light.  Neck: Normal range of motion.  Cardiovascular: Normal rate, regular rhythm and normal heart sounds.   Pulmonary/Chest: Effort normal and breath sounds normal. No respiratory distress. She has no wheezes.  Abdominal: Soft. Bowel sounds are normal. There is generalized tenderness. There is no rebound.  Slight abdominal tenderness, generalized; normal bowel sounds; no peritonitis noted  Musculoskeletal: Normal range of motion.  Neurological: She is alert and oriented to person, place, and time.  Skin: Skin is warm and dry.  Psychiatric: She  has a normal mood and affect.  Nursing note and vitals reviewed.    ED Treatments / Results  Labs (all labs ordered are listed, but only abnormal results are displayed) Labs Reviewed  COMPREHENSIVE METABOLIC PANEL - Abnormal; Notable for the following:       Result Value   CO2 21 (*)    Glucose, Bld 220 (*)    BUN 22 (*)    Creatinine, Ser 1.21 (*)    GFR calc non Af Amer 43 (*)    GFR calc Af Amer 49 (*)    Anion gap 18 (*)    All other components within normal limits  CBC - Abnormal; Notable for the following:    WBC 20.9 (*)    RBC 5.12 (*)    All other components within normal limits  I-STAT CG4 LACTIC ACID, ED - Abnormal; Notable for the following:    Lactic Acid, Venous 5.64 (*)    All other components within normal limits  CULTURE, BLOOD (ROUTINE X 2)  CULTURE, BLOOD (ROUTINE X 2)  URINE CULTURE  GASTROINTESTINAL PANEL BY PCR, STOOL (REPLACES STOOL CULTURE)  C DIFFICILE QUICK SCREEN W PCR REFLEX  LIPASE, BLOOD  URINALYSIS, ROUTINE W REFLEX MICROSCOPIC  DIFFERENTIAL  CREATININE, URINE, RANDOM  SODIUM, URINE, RANDOM  BASIC METABOLIC PANEL  CBC  LACTIC ACID, PLASMA  LACTIC ACID, PLASMA  PROCALCITONIN    EKG  EKG Interpretation None       Radiology Dg Chest 2 View  Result Date: 02/01/2017 CLINICAL DATA:  Nausea, vomiting and diarrhea.  Possible sepsis. EXAM: CHEST  2 VIEW COMPARISON:  None. FINDINGS: The heart size and mediastinal contours are within normal limits. Both lungs are clear. The visualized skeletal structures are unremarkable. IMPRESSION: No active cardiopulmonary disease. Electronically Signed   By: Deatra Robinson M.D.   On: 02/01/2017 00:21   Ct Abdomen Pelvis W Contrast  Result Date: 02/01/2017 CLINICAL DATA:  Nausea, vomiting and diarrhea EXAM: CT ABDOMEN AND PELVIS WITH CONTRAST TECHNIQUE: Multidetector CT imaging of the abdomen and pelvis was performed using the standard protocol following bolus administration of intravenous contrast.  CONTRAST:  80mL ISOVUE-300 IOPAMIDOL (ISOVUE-300) INJECTION 61% COMPARISON:  None. FINDINGS: Lower chest: No pulmonary nodules. No visible pleural or pericardial effusion. Hepatobiliary: Normal hepatic size and contours without focal liver lesion. No perihepatic ascites. No intra- or extrahepatic biliary dilatation. Normal gallbladder. Pancreas: Normal pancreatic contours and enhancement. No peripancreatic fluid collection or pancreatic ductal dilatation. Spleen: Normal. Adrenals/Urinary Tract: Normal adrenal glands. There are bilateral renal sinus cysts that measure up to 2.2 cm. Stomach/Bowel: There is no hiatal hernia. The stomach and duodenum are normal. There is no dilated small bowel or enteric inflammation. There is diffuse colonic diverticulosis without acute inflammation. The appendix is normal. Vascular/Lymphatic: There is atherosclerotic calcification of the non aneurysmal abdominal aorta. No abdominal or pelvic adenopathy. Reproductive: There are multiple small uterine  masses that are likely fibroids. No adnexal mass. Musculoskeletal: Multilevel lower lumbar facet arthrosis and osteophytosis. No spinal canal stenosis. Normal visualized extrathoracic and extraperitoneal soft tissues. Other: No contributory non-categorized findings. IMPRESSION: 1. No acute abnormality of the abdomen or pelvis. 2. Diffuse colonic diverticulosis without findings of acute diverticulitis. 3. Incidental findings include calcific aortic atherosclerosis and multiple small uterine fibroids. Electronically Signed   By: Deatra RobinsonKevin  Herman M.D.   On: 02/01/2017 00:18    Procedures Procedures (including critical care time)  Medications Ordered in ED Medications  iopamidol (ISOVUE-300) 61 % injection (not administered)  piperacillin-tazobactam (ZOSYN) IVPB 3.375 g (not administered)  aspirin chewable tablet 81 mg (not administered)  FLUoxetine (PROZAC) capsule 40 mg (not administered)  LORazepam (ATIVAN) tablet 0.5 mg (not  administered)  mirtazapine (REMERON) tablet 15 mg (not administered)  montelukast (SINGULAIR) tablet 10 mg (not administered)  albuterol (PROVENTIL) (2.5 MG/3ML) 0.083% nebulizer solution 2.5 mg (not administered)  0.9 %  sodium chloride infusion (not administered)  hydrOXYzine (VISTARIL) injection 25 mg (not administered)  enoxaparin (LOVENOX) injection 40 mg (not administered)  sodium chloride flush (NS) 0.9 % injection 3 mL (not administered)  acetaminophen (TYLENOL) tablet 650 mg (not administered)    Or  acetaminophen (TYLENOL) suppository 650 mg (not administered)  morphine 2 MG/ML injection 1 mg (not administered)  zolpidem (AMBIEN) tablet 5 mg (not administered)  metroNIDAZOLE (FLAGYL) IVPB 500 mg (not administered)  budesonide (PULMICORT) nebulizer solution 1 mg (not administered)  ondansetron (ZOFRAN) injection 4 mg (4 mg Intravenous Given 01/31/17 2240)  sodium chloride 0.9 % bolus 1,000 mL (0 mLs Intravenous Stopped 02/01/17 0016)    And  sodium chloride 0.9 % bolus 1,000 mL (0 mLs Intravenous Stopped 02/01/17 0000)  acetaminophen (TYLENOL) tablet 1,000 mg (1,000 mg Oral Given 01/31/17 2315)  piperacillin-tazobactam (ZOSYN) IVPB 3.375 g (0 g Intravenous Stopped 02/01/17 0045)  vancomycin (VANCOCIN) IVPB 1000 mg/200 mL premix (0 mg Intravenous Stopped 02/01/17 0226)  iopamidol (ISOVUE-300) 61 % injection 80 mL (80 mLs Intravenous Contrast Given 01/31/17 2359)     Initial Impression / Assessment and Plan / ED Course  I have reviewed the triage vital signs and the nursing notes.  Pertinent labs & imaging results that were available during my care of the patient were reviewed by me and considered in my medical decision making (see chart for details).  76 year old female here with abdominal pain, nausea, vomiting, and diarrhea. She is febrile, tachycardic and tachypneic on arrival. Code sepsis activated.  On exam she has rigors noted.  Abdomen is soft but there is some mild generalized  tenderness. No peritonitis. Labwork with lactate of 5.6 as well as white count of 20.9.  Chest x-ray is clear. CT scan obtained without acute findings. After IV fluids and Tylenol, fever has resolved, heart rate is normal. Blood pressure is soft but stable. Patient states she is feeling somewhat better. She continues to appear very dry on exam. Blood and urine cultures pending. She has been given vancomycin and Zosyn here in the ED. Will admit for ongoing care.  Case discussed with Dr. Clyde LundborgNiu-- will admit for ongoing care.  Final Clinical Impressions(s) / ED Diagnoses   Final diagnoses:  Abdominal pain, unspecified abdominal location  Nausea vomiting and diarrhea    New Prescriptions New Prescriptions   No medications on file     Oletha BlendSanders, Arelly Whittenberg M, PA-C 02/01/17 0303    Rolland PorterJames, Mark, MD 02/13/17 [phone]

## 2017-01-31 NOTE — ED Notes (Signed)
PT noted to have tan, watery,  and mucus stool

## 2017-01-31 NOTE — Progress Notes (Signed)
Pharmacy Antibiotic Note  Mariah MatarBetty S King is a 76 y.o. female c/o nause/vomiting and diarrhea admitted on 01/31/2017 with sepsis.  Pharmacy has been consulted for zosyn/vancomycin dosing.  Plan: Zosyn 3.375g IV q8h (4 hour infusion).  Vancomycin 1 Gm IV q24h VT=15-20 mg/L Daily Scr F/u cultures/levels  Height: 5\' 4"  (162.6 cm) Weight: 143 lb (64.9 kg) IBW/kg (Calculated) : 54.7  Temp (24hrs), Avg:103.1 F (39.5 C), Min:102.9 F (39.4 C), Max:103.3 F (39.6 C)   Recent Labs Lab 01/31/17 2223 01/31/17 2253  WBC 20.9*  --   LATICACIDVEN  --  5.64*    CrCl cannot be calculated (No order found.).    Allergies  Allergen Reactions  . Sulfa Antibiotics Other (See Comments)    Childhood, unknown reaction    Antimicrobials this admission: 6/2 zosyn >>  6/2 vancomycin >>   Dose adjustments this admission:   Microbiology results:  BCx:   UCx:    Sputum:    MRSA PCR:   Thank you for allowing pharmacy to be a part of this patient's care.  Mariah King, Mariah King 01/31/2017 11:13 PM

## 2017-01-31 NOTE — ED Notes (Signed)
Pt has a lactic acid of 5.64 EDP James made aware.

## 2017-01-31 NOTE — ED Provider Notes (Signed)
Patient seen and evaluated. Discussed with Sharilyn SitesLisa Sanders PA-C. Patient with acute febrile illness tonight with vomiting and diarrhea. Abdominal cramps without significant pain. Chills and rigors. Has tachycardia, leukocytosis, lactic acidosis. Agree with. Culture, broad-spectrum antibiotics, and sepsis protocol antibiotics. We'll follow closely.   Rolland PorterJames, Adarsh Mundorf, MD 01/31/17 2320

## 2017-01-31 NOTE — ED Notes (Signed)
Bed: [id number] Expected date:  Expected time:  Means of arrival:  Comments: 75 f n/v d

## 2017-01-31 NOTE — ED Notes (Signed)
Pt is alert and oriented x 4 and is verbally responsive, Pt is anxious and shaking unable to stay still and is c/o of dry heaving vomiting,  nausea and diarrhea. Pt reports began at 6pm today and has been non-stop. Pt was given Zofran. Pt has temp of 102.9. Md made aware Tylrnol admin. Pt IV placed NS IV bolus infusing.

## 2017-02-01 ENCOUNTER — Encounter (HOSPITAL_COMMUNITY): Payer: Self-pay | Admitting: Internal Medicine

## 2017-02-01 DIAGNOSIS — R197 Diarrhea, unspecified: Secondary | ICD-10-CM

## 2017-02-01 DIAGNOSIS — F419 Anxiety disorder, unspecified: Secondary | ICD-10-CM | POA: Diagnosis present

## 2017-02-01 DIAGNOSIS — J45909 Unspecified asthma, uncomplicated: Secondary | ICD-10-CM

## 2017-02-01 DIAGNOSIS — R1084 Generalized abdominal pain: Secondary | ICD-10-CM

## 2017-02-01 DIAGNOSIS — R112 Nausea with vomiting, unspecified: Secondary | ICD-10-CM | POA: Diagnosis not present

## 2017-02-01 DIAGNOSIS — N179 Acute kidney failure, unspecified: Secondary | ICD-10-CM | POA: Diagnosis present

## 2017-02-01 DIAGNOSIS — F32A Depression, unspecified: Secondary | ICD-10-CM | POA: Diagnosis present

## 2017-02-01 DIAGNOSIS — F329 Major depressive disorder, single episode, unspecified: Secondary | ICD-10-CM | POA: Diagnosis present

## 2017-02-01 DIAGNOSIS — E876 Hypokalemia: Secondary | ICD-10-CM | POA: Diagnosis present

## 2017-02-01 DIAGNOSIS — E860 Dehydration: Secondary | ICD-10-CM | POA: Diagnosis present

## 2017-02-01 DIAGNOSIS — A084 Viral intestinal infection, unspecified: Secondary | ICD-10-CM | POA: Diagnosis not present

## 2017-02-01 DIAGNOSIS — R109 Unspecified abdominal pain: Secondary | ICD-10-CM | POA: Diagnosis present

## 2017-02-01 DIAGNOSIS — R1030 Lower abdominal pain, unspecified: Secondary | ICD-10-CM | POA: Diagnosis present

## 2017-02-01 DIAGNOSIS — A419 Sepsis, unspecified organism: Secondary | ICD-10-CM | POA: Diagnosis not present

## 2017-02-01 LAB — DIFFERENTIAL
BASOS ABS: 0 10*3/uL (ref 0.0–0.1)
BASOS PCT: 0 %
EOS ABS: 0 10*3/uL (ref 0.0–0.7)
EOS PCT: 0 %
Lymphocytes Relative: 2 %
Lymphs Abs: 0.3 10*3/uL — ABNORMAL LOW (ref 0.7–4.0)
Monocytes Absolute: 0.8 10*3/uL (ref 0.1–1.0)
Monocytes Relative: 5 %
NEUTROS PCT: 93 %
Neutro Abs: 13.4 10*3/uL — ABNORMAL HIGH (ref 1.7–7.7)

## 2017-02-01 LAB — URINALYSIS, ROUTINE W REFLEX MICROSCOPIC
Bilirubin Urine: NEGATIVE
Glucose, UA: 50 mg/dL — AB
Hgb urine dipstick: NEGATIVE
KETONES UR: 5 mg/dL — AB
LEUKOCYTES UA: NEGATIVE
Nitrite: NEGATIVE
PROTEIN: NEGATIVE mg/dL
Specific Gravity, Urine: >1.046 — ABNORMAL HIGH (ref 1.005–1.030)
pH: 5 (ref 5.0–8.0)

## 2017-02-01 LAB — GLUCOSE, CAPILLARY: Glucose-Capillary: 152 mg/dL — ABNORMAL HIGH (ref 65–99)

## 2017-02-01 LAB — BASIC METABOLIC PANEL
Anion gap: 8 (ref 5–15)
BUN: 18 mg/dL (ref 6–20)
CHLORIDE: 113 mmol/L — AB (ref 101–111)
CO2: 22 mmol/L (ref 22–32)
Calcium: 7.5 mg/dL — ABNORMAL LOW (ref 8.9–10.3)
Creatinine, Ser: 1.15 mg/dL — ABNORMAL HIGH (ref 0.44–1.00)
GFR calc Af Amer: 53 mL/min — ABNORMAL LOW (ref >60)
GFR calc non Af Amer: 45 mL/min — ABNORMAL LOW (ref >60)
GLUCOSE: 160 mg/dL — AB (ref 65–99)
POTASSIUM: 3.5 mmol/L (ref 3.5–5.1)
SODIUM: 143 mmol/L (ref 135–145)

## 2017-02-01 LAB — CBC
HCT: 39.7 % (ref 36.0–46.0)
HEMOGLOBIN: 12.8 g/dL (ref 12.0–15.0)
MCH: 28.6 pg (ref 26.0–34.0)
MCHC: 32.2 g/dL (ref 30.0–36.0)
MCV: 88.6 fL (ref 78.0–100.0)
PLATELETS: 278 10*3/uL (ref 150–400)
RBC: 4.48 MIL/uL (ref 3.87–5.11)
RDW: 14.8 % (ref 11.5–15.5)
WBC: 14.5 10*3/uL — AB (ref 4.0–10.5)

## 2017-02-01 LAB — C DIFFICILE QUICK SCREEN W PCR REFLEX
C DIFFICILE (CDIFF) TOXIN: NEGATIVE
C DIFFICLE (CDIFF) ANTIGEN: NEGATIVE
C Diff interpretation: NOT DETECTED

## 2017-02-01 LAB — PROCALCITONIN: Procalcitonin: 19.79 ng/mL

## 2017-02-01 LAB — CREATININE, URINE, RANDOM: Creatinine, Urine: 111.64 mg/dL

## 2017-02-01 LAB — LACTIC ACID, PLASMA
LACTIC ACID, VENOUS: 2.5 mmol/L — AB (ref 0.5–1.9)
LACTIC ACID, VENOUS: 2.4 mmol/L — AB (ref 0.5–1.9)

## 2017-02-01 LAB — SODIUM, URINE, RANDOM: Sodium, Ur: 63 mmol/L

## 2017-02-01 MED ORDER — FLUOXETINE HCL 20 MG PO CAPS
40.0000 mg | ORAL_CAPSULE | Freq: Every day | ORAL | Status: DC
  Administered 2017-02-01 – 2017-02-03 (×3): 40 mg via ORAL
  Filled 2017-02-01 (×3): qty 2

## 2017-02-01 MED ORDER — CHLORHEXIDINE GLUCONATE 0.12 % MT SOLN
15.0000 mL | Freq: Two times a day (BID) | OROMUCOSAL | Status: DC
  Administered 2017-02-01 – 2017-02-03 (×5): 15 mL via OROMUCOSAL
  Filled 2017-02-01 (×5): qty 15

## 2017-02-01 MED ORDER — LORAZEPAM 0.5 MG PO TABS
0.5000 mg | ORAL_TABLET | Freq: Every day | ORAL | Status: DC
  Administered 2017-02-01 – 2017-02-03 (×3): 0.5 mg via ORAL
  Filled 2017-02-01 (×3): qty 1

## 2017-02-01 MED ORDER — ACETAMINOPHEN 325 MG PO TABS
650.0000 mg | ORAL_TABLET | Freq: Four times a day (QID) | ORAL | Status: DC | PRN
  Administered 2017-02-01: 650 mg via ORAL
  Filled 2017-02-01: qty 2

## 2017-02-01 MED ORDER — MIRTAZAPINE 15 MG PO TABS
15.0000 mg | ORAL_TABLET | Freq: Every day | ORAL | Status: DC
  Administered 2017-02-01 – 2017-02-02 (×2): 15 mg via ORAL
  Filled 2017-02-01: qty 1
  Filled 2017-02-01: qty 0.5
  Filled 2017-02-01: qty 1

## 2017-02-01 MED ORDER — SODIUM CHLORIDE 0.9 % IV SOLN
INTRAVENOUS | Status: DC
  Administered 2017-02-01 – 2017-02-02 (×3): via INTRAVENOUS

## 2017-02-01 MED ORDER — ALBUTEROL SULFATE (2.5 MG/3ML) 0.083% IN NEBU: 2.5000 mg | INHALATION_SOLUTION | RESPIRATORY_TRACT | Status: DC | PRN

## 2017-02-01 MED ORDER — MOMETASONE FUROATE 220 MCG/INH IN AEPB: 2.0000 | INHALATION_SPRAY | Freq: Two times a day (BID) | RESPIRATORY_TRACT | Status: DC

## 2017-02-01 MED ORDER — SODIUM CHLORIDE 0.9% FLUSH
3.0000 mL | Freq: Two times a day (BID) | INTRAVENOUS | Status: DC
  Administered 2017-02-01 – 2017-02-03 (×3): 3 mL via INTRAVENOUS

## 2017-02-01 MED ORDER — ZOLPIDEM TARTRATE 5 MG PO TABS: 5.0000 mg | ORAL_TABLET | Freq: Every evening | ORAL | Status: DC | PRN

## 2017-02-01 MED ORDER — MORPHINE SULFATE (PF) 2 MG/ML IV SOLN: 1.0000 mg | INTRAVENOUS | Status: DC | PRN

## 2017-02-01 MED ORDER — ENOXAPARIN SODIUM 40 MG/0.4ML ~~LOC~~ SOLN
40.0000 mg | SUBCUTANEOUS | Status: DC
  Administered 2017-02-01 – 2017-02-03 (×3): 40 mg via SUBCUTANEOUS
  Filled 2017-02-01 (×3): qty 0.4

## 2017-02-01 MED ORDER — ASPIRIN 81 MG PO CHEW
81.0000 mg | CHEWABLE_TABLET | Freq: Every day | ORAL | Status: DC
  Administered 2017-02-01 – 2017-02-03 (×3): 81 mg via ORAL
  Filled 2017-02-01 (×3): qty 1

## 2017-02-01 MED ORDER — BUDESONIDE 0.5 MG/2ML IN SUSP
1.0000 mg | Freq: Two times a day (BID) | RESPIRATORY_TRACT | Status: DC
  Administered 2017-02-01 – 2017-02-03 (×5): 1 mg via RESPIRATORY_TRACT
  Filled 2017-02-01 (×5): qty 4

## 2017-02-01 MED ORDER — ACETAMINOPHEN 650 MG RE SUPP: 650.0000 mg | Freq: Four times a day (QID) | RECTAL | Status: DC | PRN

## 2017-02-01 MED ORDER — MONTELUKAST SODIUM 10 MG PO TABS
10.0000 mg | ORAL_TABLET | Freq: Every day | ORAL | Status: DC
  Administered 2017-02-01 – 2017-02-03 (×3): 10 mg via ORAL
  Filled 2017-02-01 (×3): qty 1

## 2017-02-01 MED ORDER — ONDANSETRON HCL 4 MG/2ML IJ SOLN
4.0000 mg | Freq: Four times a day (QID) | INTRAMUSCULAR | Status: DC | PRN
  Administered 2017-02-01 – 2017-02-02 (×5): 4 mg via INTRAVENOUS
  Filled 2017-02-01 (×8): qty 2

## 2017-02-01 MED ORDER — ORAL CARE MOUTH RINSE
15.0000 mL | Freq: Two times a day (BID) | OROMUCOSAL | Status: DC
  Administered 2017-02-01 – 2017-02-02 (×4): 15 mL via OROMUCOSAL

## 2017-02-01 MED ORDER — HYDROXYZINE HCL 50 MG/ML IM SOLN
25.0000 mg | Freq: Four times a day (QID) | INTRAMUSCULAR | Status: DC | PRN
  Administered 2017-02-02: 25 mg via INTRAMUSCULAR
  Filled 2017-02-01 (×2): qty 0.5

## 2017-02-01 MED ORDER — METRONIDAZOLE IN NACL 5-0.79 MG/ML-% IV SOLN
500.0000 mg | Freq: Once | INTRAVENOUS | Status: AC
  Administered 2017-02-01: 500 mg via INTRAVENOUS
  Filled 2017-02-01: qty 100

## 2017-02-01 NOTE — H&P (Addendum)
History and Physical    Mariah MatarBetty S Felix NWG:[id number] DOB: 03-14-1941 DOA: 01/31/2017  Referring MD/NP/PA:   PCP: Patient, No Pcp Per   Patient coming from:  The patient is coming from home.  At baseline, pt is independent for most of ADL.  Chief Complaint: Nausea, vomiting, diarrhea and abdominal pain  HPI: Mariah King is a 76 y.o. female with medical history significant of asthma, depression and anxiety, who presents with nausea, vomiting, diarrhea and abdominal pain.  Patient states that her symptoms suddenly started at about 6 PM. She vomited more than 10 times without blood in the vomitus. She has watery stool bowel movement for more than 10 times. She has abdominal pain, which is located in the middle abdomen, constant, cramping pain, 10 out of 10 in severity, nonradiating. She also has fever and chills. No hematochezia. Patient denies chest pain, shortness breath, cough, symptoms of UTI or unilateral weakness. Patient did not eat unusual foods, no recent traveling.  ED Course: pt was found to have WBC 20.9, lactic acid 5.64, acute renal injury with creatinine 1.21, temperature 103.3, tachycardia, tachypnea, oxygen satting 90% on room air, negative chest x-ray. Pending UA. CT abdomen/pelvis is not impressive. Patient is admitted to telemetry bed as inpatient.  Review of Systems:   General: has fevers, chills, no changes in body weight, has poor appetite, has fatigue HEENT: no blurry vision, hearing changes or sore throat Respiratory: no dyspnea, coughing, wheezing CV: no chest pain, no palpitations GI: has nausea, vomiting, abdominal pain, diarrhea, no constipation GU: no dysuria, burning on urination, increased urinary frequency, hematuria  Ext: no leg edema Neuro: no unilateral weakness, numbness, or tingling, no vision change or hearing loss Skin: no rash, no skin tear. MSK: No muscle spasm, no deformity, no limitation of range of movement in spin Heme: No easy bruising.    Travel history: No recent long distant travel.  Allergy:  Allergies  Allergen Reactions  . Sulfa Antibiotics Other (See Comments)    Childhood, unknown reaction    Past Medical History:  Diagnosis Date  . Asthma   . Depression     Past Surgical History:  Procedure Laterality Date  . TONSILLECTOMY    . TUBAL LIGATION      Social History:  reports that she has never smoked. She has never used smokeless tobacco. She reports that she does not drink alcohol or use drugs.  Family History:  Family History  Problem Relation Age of Onset  . Lung cancer Mother   . Celiac disease Sister      Prior to Admission medications   Medication Sig Start Date End Date Taking? Authorizing Provider  aspirin 81 MG chewable tablet Chew 81 mg by mouth every morning.    Yes [provider]  FLUoxetine (PROZAC) 40 MG capsule Take 40 mg by mouth every morning.    Yes [provider]  LORazepam (ATIVAN) 0.5 MG tablet Take 0.5 mg by mouth every morning.    Yes [provider]  mirtazapine (REMERON) 15 MG tablet Take 15 mg by mouth at bedtime.   Yes [provider]  mometasone (ASMANEX) 220 MCG/INH inhaler Inhale 2 puffs into the lungs 2 (two) times daily.   Yes [provider]  montelukast (SINGULAIR) 10 MG tablet Take 10 mg by mouth every morning.    Yes [provider]  Vitamin D, Ergocalciferol, (DRISDOL) 50000 units CAPS capsule Take 50,000 Units by mouth every 7 (seven) days.   Yes [provider]    Physical Exam: Vitals:   02/01/17 0030 02/01/17 0050 02/01/17 0204 02/01/17 0220  BP: (!) 101/55 (!) 101/55  (!) 101/51  Pulse: 95 92 (P) 83 81  Resp: (!) 26 19 (P) 20 18  Temp:      TempSrc:      SpO2: (!) 87% 90% (P) 91% 92%  Weight:      Height:       General: Not in acute distress HEENT:       Eyes: PERRL, EOMI, no scleral icterus.       ENT: No discharge from the ears and nose, no pharynx injection, no tonsillar  enlargement.        Neck: No JVD, no bruit, no mass felt. Heme: No neck lymph node enlargement. Cardiac: S1/S2, RRR, No murmurs, No gallops or rubs. Respiratory: No rales, wheezing, rhonchi or rubs. GI: Soft, nondistended, has diffuse tenderness, no rebound pain, no organomegaly, BS present. GU: No hematuria Ext: No pitting leg edema bilaterally. 2+DP/PT pulse bilaterally. Musculoskeletal: No joint deformities, No joint redness or warmth, no limitation of ROM in spin. Skin: No rashes.  Neuro: Alert, oriented X3, cranial nerves II-XII grossly intact, moves all extremities normally. Psych: Patient is not psychotic, no suicidal or hemocidal ideation.  Labs on Admission: I have personally reviewed following labs and imaging studies  CBC:  Recent Labs Lab 01/31/17 2223  WBC 20.9*  HGB 15.0  HCT 44.7  MCV 87.3  PLT 341   Basic Metabolic Panel:  Recent Labs Lab 01/31/17 2223  NA 142  K 3.8  CL 103  CO2 21*  GLUCOSE 220*  BUN 22*  CREATININE 1.21*  CALCIUM 9.2   GFR: Estimated Creatinine Clearance: 34.7 mL/min (A) (by C-G formula based on SCr of 1.21 mg/dL (H)). Liver Function Tests:  Recent Labs Lab 01/31/17 2223  AST 27  ALT 14  ALKPHOS 105  BILITOT 0.7  PROT 7.6  ALBUMIN 3.7    Recent Labs Lab 01/31/17 2223  LIPASE 28   No results for input(s): AMMONIA in the last 168 hours. Coagulation Profile: No results for input(s): INR, PROTIME in the last 168 hours. Cardiac Enzymes: No results for input(s): CKTOTAL, CKMB, CKMBINDEX, TROPONINI in the last 168 hours. BNP (last 3 results) No results for input(s): PROBNP in the last 8760 hours. HbA1C: No results for input(s): HGBA1C in the last 72 hours. CBG: No results for input(s): GLUCAP in the last 168 hours. Lipid Profile: No results for input(s): CHOL, HDL, LDLCALC, TRIG, CHOLHDL, LDLDIRECT in the last 72 hours. Thyroid Function Tests: No results for input(s): TSH, T4TOTAL, FREET4, T3FREE, THYROIDAB in the  last 72 hours. Anemia Panel: No results for input(s): VITAMINB12, FOLATE, FERRITIN, TIBC, IRON, RETICCTPCT in the last 72 hours. Urine analysis: No results found for: COLORURINE, APPEARANCEUR, LABSPEC, PHURINE, GLUCOSEU, HGBUR, BILIRUBINUR, KETONESUR, PROTEINUR, UROBILINOGEN, NITRITE, LEUKOCYTESUR Sepsis Labs: @LABRCNTIP (procalcitonin:4,lacticidven:4) )No results found for this or any previous visit (from the past 240 hour(s)).   Radiological Exams on Admission: Dg Chest 2 View  Result Date: 02/01/2017 CLINICAL DATA:  Nausea, vomiting and diarrhea.  Possible sepsis. EXAM: CHEST  2 VIEW COMPARISON:  None. FINDINGS: The heart size and mediastinal contours are within normal limits. Both lungs are clear. The visualized skeletal structures are unremarkable. IMPRESSION: No active cardiopulmonary disease. Electronically Signed   By: Deatra Robinson M.D.   On: 02/01/2017 00:21   Ct Abdomen Pelvis W Contrast  Result Date: 02/01/2017 CLINICAL DATA:  Nausea, vomiting and diarrhea EXAM:  CT ABDOMEN AND PELVIS WITH CONTRAST TECHNIQUE: Multidetector CT imaging of the abdomen and pelvis was performed using the standard protocol following bolus administration of intravenous contrast. CONTRAST:  80mL ISOVUE-300 IOPAMIDOL (ISOVUE-300) INJECTION 61% COMPARISON:  None. FINDINGS: Lower chest: No pulmonary nodules. No visible pleural or pericardial effusion. Hepatobiliary: Normal hepatic size and contours without focal liver lesion. No perihepatic ascites. No intra- or extrahepatic biliary dilatation. Normal gallbladder. Pancreas: Normal pancreatic contours and enhancement. No peripancreatic fluid collection or pancreatic ductal dilatation. Spleen: Normal. Adrenals/Urinary Tract: Normal adrenal glands. There are bilateral renal sinus cysts that measure up to 2.2 cm. Stomach/Bowel: There is no hiatal hernia. The stomach and duodenum are normal. There is no dilated small bowel or enteric inflammation. There is diffuse colonic  diverticulosis without acute inflammation. The appendix is normal. Vascular/Lymphatic: There is atherosclerotic calcification of the non aneurysmal abdominal aorta. No abdominal or pelvic adenopathy. Reproductive: There are multiple small uterine masses that are likely fibroids. No adnexal mass. Musculoskeletal: Multilevel lower lumbar facet arthrosis and osteophytosis. No spinal canal stenosis. Normal visualized extrathoracic and extraperitoneal soft tissues. Other: No contributory non-categorized findings. IMPRESSION: 1. No acute abnormality of the abdomen or pelvis. 2. Diffuse colonic diverticulosis without findings of acute diverticulitis. 3. Incidental findings include calcific aortic atherosclerosis and multiple small uterine fibroids. Electronically Signed   By: Deatra Robinson M.D.   On: 02/01/2017 00:18     EKG: Independently reviewed.  Sinus rhythm, QTC 505, low voltage, ST depression in V3-V5, T-wave inversion in lead 3/aVF.  Assessment/Plan Principal Problem:   Nausea vomiting and diarrhea Active Problems:   Asthma   AKI (acute kidney injury) (HCC)   Sepsis (HCC)   Abdominal pain   Depression   Nausea vomiting and diarrhea, AP and sepsis: Patient meets criteria for sepsis with leukocytosis, elevated lactate acid 5.64, fever, tachycardia and tachypnea. Currently hemodynamically stable. Etiology is not clear. Likely due to viral gastritis, but need to rule out other possibilities, such as C. difficile colitis.  - will admit to tele bed - prn morphine for pain  - hydroxyzine prn for nausea (QTc 505, not good candidate for Zofran) - Will check C diff pcr, GI pathogen panel - Blood culture x 2 - IV vancomycin and Zosyn were ordered by EDP. Will D/C vancomycin, and continue Zosyn. - give one dose of Flagyl - will get Procalcitonin and trend lactic acid level per sepsis protocol - IVF: 2.0 L of NS bolus in ED, followed by 125 cc/h -f/u UA, Bx and Ux.  AKI: Likely due to prerenal  secondary to dehydration - IVF as above - Check FeNa  - Follow up renal function by BMP  Depression and anxiety: Stable, no suicidal or homicidal ideations. -Continue home medications: Prozac, Ativan, Remeron  Asthma: stable -When necessary albuterol nebulizers -Singulair, -Asmanex inhaler   DVT ppx: SQ Lovenox Code Status: Partial code (I discussed with the patient, and explained the meaning of CODE STATUS, patient wants to be partial code, OK for CPR, but no intubation). Family Communication:  Yes, patient's husband at bed side Disposition Plan:  Anticipate discharge back to previous home environment Consults called:  none Admission status:  Inpatient/tele      Date of Service 02/01/2017    Lorretta Harp [facility] Pager [phone]  If 7PM-7AM, please contact night-coverage [email or web address] Password [email or web address] 02/01/2017, 2:21 AM

## 2017-02-01 NOTE — Progress Notes (Signed)
PROGRESS NOTE  Mariah King ZOX:[id number] DOB: Nov 13, 1940 DOA: 01/31/2017 PCP: Patient, No Pcp Per  HPI/Recap of past 24 hours:  Still feeling nauseous, vomiting, diarrhea, fever down from 102 to 100.5  Assessment/Plan: Principal Problem:   Nausea vomiting and diarrhea Active Problems:   Asthma   AKI (acute kidney injury) (HCC)   Sepsis (HCC)   Abdominal pain   Depression   Nausea vomiting and diarrhea, Abdominal pain and sepsis:  Patient meets criteria for sepsis with leukocytosis, elevated lactate acid 5.64, fever, tachycardia and tachypnea.  Etiology is not clear. Likely due to viral gastritis,C. difficile negative, gi prc panel pending, ct ab/pel does show diffuse diverticula but not acute infection  -  Blood culture x 2 in process, ua no infection, cxr no acute findings - IV vancomycin and Zosyn were ordered by EDP. vancomycin d/ced, , and continue Zosyn. -  IVF: 2.0 L of NS bolus in ED, followed by 125 cc/h -f/u UA, Bx and Ux.  AKI: Likely due to prerenal secondary to dehydration, ua no infection - IVF as above   Depression and anxiety: Stable, no suicidal or homicidal ideations. -Continue home medications: Prozac, Ativan, Remeron  Asthma: stable -When necessary albuterol nebulizers -Singulair, -Asmanex inhaler   DVT ppx: SQ Lovenox Code Status: Partial code (I discussed with the patient, and explained the meaning of CODE STATUS, patient wants to be partial code, OK for CPR, but no intubation). Family Communication:  patient Disposition Plan:  Anticipate discharge back to previous home environment Consults called:  none   Procedures:  none  Antibiotics:  vanc x1  Zosyn from admission   Objective: BP (!) 109/58 (BP Location: Right Arm)   Pulse 78   Temp 100.1 F (37.8 C) (Rectal)   Resp 18   Ht 5\' 4"  (1.626 m)   Wt 65.2 kg (143 lb 11.8 oz)   SpO2 94%   BMI 24.67 kg/m   Intake/Output Summary (Last 24 hours) at 02/01/17 0803 Last data  filed at 02/01/17 0500  Gross per 24 hour  Intake          3383.33 ml  Output               70 ml  Net          3313.33 ml   Filed Weights   01/31/17 2210 02/01/17 0300  Weight: 64.9 kg (143 lb) 65.2 kg (143 lb 11.8 oz)    Exam:   General:  Does not look comfortable  Cardiovascular: RRR  Respiratory: CTABL  Abdomen: diffuse mild tender, no guarding, no rebound, positive BS  Musculoskeletal: No Edema  Neuro: aaox3  Data Reviewed: Basic Metabolic Panel:  Recent Labs Lab 01/31/17 2223 02/01/17 0542  NA 142 143  K 3.8 3.5  CL 103 113*  CO2 21* 22  GLUCOSE 220* 160*  BUN 22* 18  CREATININE 1.21* 1.15*  CALCIUM 9.2 7.5*   Liver Function Tests:  Recent Labs Lab 01/31/17 2223  AST 27  ALT 14  ALKPHOS 105  BILITOT 0.7  PROT 7.6  ALBUMIN 3.7    Recent Labs Lab 01/31/17 2223  LIPASE 28   No results for input(s): AMMONIA in the last 168 hours. CBC:  Recent Labs Lab 01/31/17 2223 02/01/17 0542  WBC 20.9* 14.5*  NEUTROABS  --  13.4*  HGB 15.0 12.8  HCT 44.7 39.7  MCV 87.3 88.6  PLT 341 278   Cardiac Enzymes:   No results for input(s): CKTOTAL, CKMB, CKMBINDEX,  TROPONINI in the last 168 hours. BNP (last 3 results) No results for input(s): BNP in the last 8760 hours.  ProBNP (last 3 results) No results for input(s): PROBNP in the last 8760 hours.  CBG:  Recent Labs Lab 02/01/17 0801  GLUCAP 152*    No results found for this or any previous visit (from the past 240 hour(s)).   Studies: Dg Chest 2 View  Result Date: 02/01/2017 CLINICAL DATA:  Nausea, vomiting and diarrhea.  Possible sepsis. EXAM: CHEST  2 VIEW COMPARISON:  None. FINDINGS: The heart size and mediastinal contours are within normal limits. Both lungs are clear. The visualized skeletal structures are unremarkable. IMPRESSION: No active cardiopulmonary disease. Electronically Signed   By: Deatra RobinsonKevin  Herman M.D.   On: 02/01/2017 00:21   Ct Abdomen Pelvis W Contrast  Result Date:  02/01/2017 CLINICAL DATA:  Nausea, vomiting and diarrhea EXAM: CT ABDOMEN AND PELVIS WITH CONTRAST TECHNIQUE: Multidetector CT imaging of the abdomen and pelvis was performed using the standard protocol following bolus administration of intravenous contrast. CONTRAST:  80mL ISOVUE-300 IOPAMIDOL (ISOVUE-300) INJECTION 61% COMPARISON:  None. FINDINGS: Lower chest: No pulmonary nodules. No visible pleural or pericardial effusion. Hepatobiliary: Normal hepatic size and contours without focal liver lesion. No perihepatic ascites. No intra- or extrahepatic biliary dilatation. Normal gallbladder. Pancreas: Normal pancreatic contours and enhancement. No peripancreatic fluid collection or pancreatic ductal dilatation. Spleen: Normal. Adrenals/Urinary Tract: Normal adrenal glands. There are bilateral renal sinus cysts that measure up to 2.2 cm. Stomach/Bowel: There is no hiatal hernia. The stomach and duodenum are normal. There is no dilated small bowel or enteric inflammation. There is diffuse colonic diverticulosis without acute inflammation. The appendix is normal. Vascular/Lymphatic: There is atherosclerotic calcification of the non aneurysmal abdominal aorta. No abdominal or pelvic adenopathy. Reproductive: There are multiple small uterine masses that are likely fibroids. No adnexal mass. Musculoskeletal: Multilevel lower lumbar facet arthrosis and osteophytosis. No spinal canal stenosis. Normal visualized extrathoracic and extraperitoneal soft tissues. Other: No contributory non-categorized findings. IMPRESSION: 1. No acute abnormality of the abdomen or pelvis. 2. Diffuse colonic diverticulosis without findings of acute diverticulitis. 3. Incidental findings include calcific aortic atherosclerosis and multiple small uterine fibroids. Electronically Signed   By: Deatra RobinsonKevin  Herman M.D.   On: 02/01/2017 00:18    Scheduled Meds: . aspirin  81 mg Oral Daily  . budesonide (PULMICORT) nebulizer solution  1 mg Nebulization BID    . chlorhexidine  15 mL Mouth Rinse BID  . enoxaparin (LOVENOX) injection  40 mg Subcutaneous Q24H  . FLUoxetine  40 mg Oral Daily  . iopamidol      . LORazepam  0.5 mg Oral Daily  . mouth rinse  15 mL Mouth Rinse q12n4p  . mirtazapine  15 mg Oral QHS  . montelukast  10 mg Oral Daily  . sodium chloride flush  3 mL Intravenous Q12H    Continuous Infusions: . sodium chloride 125 mL/hr at 02/01/17 0308  . piperacillin-tazobactam (ZOSYN)  IV       No charge note  Sidi Dzikowski MD, PhD  [facility] Pager [phone]. If 7PM-7AM, please contact night-coverage at [email or web address], password [email or web address] 02/01/2017, 8:03 AM  LOS: 0 days

## 2017-02-01 NOTE — Progress Notes (Signed)
CRITICAL VALUE ALERT  Critical Value:  Lactic acid=2.4  Date & Time Notied:  02/01/17 91470917  Provider Notified: Roda ShuttersXu  Orders Received/Actions taken:

## 2017-02-01 NOTE — Progress Notes (Signed)
CRITICAL VALUE ALERT  Critical Value:  Lactic Acid 2.5  Date & Time Notied:  02/01/17 @ 0650  Provider Notified: Pixie CasinoKim, J  Orders Received/Actions taken: N/A

## 2017-02-02 ENCOUNTER — Other Ambulatory Visit: Payer: Self-pay

## 2017-02-02 LAB — URINE CULTURE: Culture: NO GROWTH

## 2017-02-02 LAB — CBC WITH DIFFERENTIAL/PLATELET
Basophils Absolute: 0 10*3/uL (ref 0.0–0.1)
Basophils Relative: 0 %
EOS ABS: 0.1 10*3/uL (ref 0.0–0.7)
EOS PCT: 1 %
HCT: 40.9 % (ref 36.0–46.0)
Hemoglobin: 13.4 g/dL (ref 12.0–15.0)
LYMPHS ABS: 1.8 10*3/uL (ref 0.7–4.0)
LYMPHS PCT: 17 %
MCH: 28.6 pg (ref 26.0–34.0)
MCHC: 32.8 g/dL (ref 30.0–36.0)
MCV: 87.2 fL (ref 78.0–100.0)
MONO ABS: 0.4 10*3/uL (ref 0.1–1.0)
Monocytes Relative: 4 %
Neutro Abs: 8.1 10*3/uL — ABNORMAL HIGH (ref 1.7–7.7)
Neutrophils Relative %: 78 %
PLATELETS: 270 10*3/uL (ref 150–400)
RBC: 4.69 MIL/uL (ref 3.87–5.11)
RDW: 15.1 % (ref 11.5–15.5)
WBC: 10.5 10*3/uL (ref 4.0–10.5)

## 2017-02-02 LAB — GASTROINTESTINAL PANEL BY PCR, STOOL (REPLACES STOOL CULTURE)
Adenovirus F40/41: NOT DETECTED
Astrovirus: NOT DETECTED
CRYPTOSPORIDIUM: NOT DETECTED
CYCLOSPORA CAYETANENSIS: NOT DETECTED
Campylobacter species: NOT DETECTED
ENTEROPATHOGENIC E COLI (EPEC): NOT DETECTED
Entamoeba histolytica: NOT DETECTED
Enteroaggregative E coli (EAEC): NOT DETECTED
Enterotoxigenic E coli (ETEC): NOT DETECTED
GIARDIA LAMBLIA: NOT DETECTED
Norovirus GI/GII: DETECTED — AB
PLESIMONAS SHIGELLOIDES: NOT DETECTED
Rotavirus A: NOT DETECTED
SALMONELLA SPECIES: NOT DETECTED
SHIGELLA/ENTEROINVASIVE E COLI (EIEC): NOT DETECTED
Sapovirus (I, II, IV, and V): NOT DETECTED
Shiga like toxin producing E coli (STEC): NOT DETECTED
VIBRIO SPECIES: NOT DETECTED
Vibrio cholerae: NOT DETECTED
YERSINIA ENTEROCOLITICA: NOT DETECTED

## 2017-02-02 LAB — BASIC METABOLIC PANEL
Anion gap: 7 (ref 5–15)
Anion gap: 8 (ref 5–15)
BUN: 16 mg/dL (ref 6–20)
BUN: 15 mg/dL (ref 6–20)
CALCIUM: 7.2 mg/dL — AB (ref 8.9–10.3)
CO2: 19 mmol/L — ABNORMAL LOW (ref 22–32)
CO2: 18 mmol/L — AB (ref 22–32)
CREATININE: 1.18 mg/dL — AB (ref 0.44–1.00)
CREATININE: 1.31 mg/dL — AB (ref 0.44–1.00)
Calcium: 7.4 mg/dL — ABNORMAL LOW (ref 8.9–10.3)
Chloride: 117 mmol/L — ABNORMAL HIGH (ref 101–111)
Chloride: 119 mmol/L — ABNORMAL HIGH (ref 101–111)
GFR calc Af Amer: 51 mL/min — ABNORMAL LOW (ref >60)
GFR calc non Af Amer: 39 mL/min — ABNORMAL LOW (ref >60)
GFR, EST AFRICAN AMERICAN: 45 mL/min — AB (ref >60)
GFR, EST NON AFRICAN AMERICAN: 44 mL/min — AB (ref >60)
GLUCOSE: 142 mg/dL — AB (ref 65–99)
Glucose, Bld: 147 mg/dL — ABNORMAL HIGH (ref 65–99)
POTASSIUM: 2.5 mmol/L — AB (ref 3.5–5.1)
Potassium: 3.4 mmol/L — ABNORMAL LOW (ref 3.5–5.1)
SODIUM: 143 mmol/L (ref 135–145)
SODIUM: 145 mmol/L (ref 135–145)

## 2017-02-02 LAB — LACTIC ACID, PLASMA: Lactic Acid, Venous: 1 mmol/L (ref 0.5–1.9)

## 2017-02-02 LAB — GLUCOSE, CAPILLARY: Glucose-Capillary: 135 mg/dL — ABNORMAL HIGH (ref 65–99)

## 2017-02-02 LAB — MAGNESIUM
MAGNESIUM: 2.6 mg/dL — AB (ref 1.7–2.4)
Magnesium: 1.5 mg/dL — ABNORMAL LOW (ref 1.7–2.4)

## 2017-02-02 MED ORDER — LACTATED RINGERS IV BOLUS (SEPSIS)
1000.0000 mL | Freq: Once | INTRAVENOUS | Status: AC
  Administered 2017-02-02: 1000 mL via INTRAVENOUS

## 2017-02-02 MED ORDER — MAGNESIUM SULFATE 2 GM/50ML IV SOLN
2.0000 g | Freq: Once | INTRAVENOUS | Status: AC
  Administered 2017-02-02: 2 g via INTRAVENOUS
  Filled 2017-02-02: qty 50

## 2017-02-02 MED ORDER — POTASSIUM CHLORIDE CRYS ER 20 MEQ PO TBCR
60.0000 meq | EXTENDED_RELEASE_TABLET | Freq: Once | ORAL | Status: AC
Start: 2017-02-02 — End: 2017-02-02
  Administered 2017-02-02: 60 meq via ORAL
  Filled 2017-02-02: qty 3

## 2017-02-02 MED ORDER — POTASSIUM CHLORIDE 10 MEQ/100ML IV SOLN
10.0000 meq | INTRAVENOUS | Status: AC
  Administered 2017-02-02 (×2): 10 meq via INTRAVENOUS
  Filled 2017-02-02 (×2): qty 100

## 2017-02-02 MED ORDER — POTASSIUM CHLORIDE 2 MEQ/ML IV SOLN
INTRAVENOUS | Status: DC
  Administered 2017-02-02 (×2): via INTRAVENOUS
  Filled 2017-02-02 (×4): qty 1000

## 2017-02-02 NOTE — Progress Notes (Addendum)
PROGRESS NOTE  Mariah King [id number]:[id number] DOB: 1941/07/07 DOA: 01/31/2017 PCP: Patient, No Pcp Per  HPI/Recap of past 24 hours:  Still feeling nauseous, dry heaving, diarrhea seems slowed down, vomiting, diarrhea, fever seems has resolved, Husband at bedside  Assessment/Plan: Principal Problem:   Nausea vomiting and diarrhea Active Problems:   Asthma   AKI (acute kidney injury) (HCC)   Sepsis (HCC)   Abdominal pain   Depression   Nausea vomiting and diarrhea, Abdominal pain and sepsis:  --Patient meets criteria for sepsis with leukocytosis, elevated lactate acid 5.64, fever, tachycardia and tachypnea.   --C. difficile negative, GI prc panel +norovirus, -- ct ab/pel does show diffuse diverticula but not acute infection - Blood culture x 2 no growth, ua no infection, cxr no acute findings - IV vancomycin and Zosyn were ordered by EDP. vancomycin d/ced on 6/3 , d/c Zosyn on 6/4 now that gi prc panel + norovirus -  Improving, lactic acid normalized, wbc normalized. Change ivf from ns to LR/k. Start clears. Increase as tolerated.   AKI:  -Likely due to prerenal secondary to dehydration,  -ua no infection -continue IVF  Hypokalemia/hypomagnesemia: replace k/mag  Qtc prolongation Qtc on admission 505, repeat Qtc on 6/4 am, Qtc 466  Depression and anxiety: Stable, no suicidal or homicidal ideations. -Continue home medications: Prozac, Ativan, Remeron  Asthma: stable -When necessary albuterol nebulizers -Singulair, -Asmanex inhaler   DVT ppx: SQ Lovenox Code Status: Partial code (I discussed with the patient, and explained the meaning of CODE STATUS, patient wants to be partial code, OK for CPR, but no intubation). Family Communication:  patient and husband at bedside Disposition Plan:  Anticipate discharge back to previous home environment in 24-48hrs Consults called:  none   Procedures:  none  Antibiotics:  vanc x1  Zosyn from admission to  6/4   Objective: BP (!) 111/54 (BP Location: Right Arm)   Pulse 87   Temp 97.5 F (36.4 C) (Oral)   Resp 19   Ht 5\' 4"  (1.626 m)   Wt 65.2 kg (143 lb 11.8 oz)   SpO2 98%   BMI 24.67 kg/m   Intake/Output Summary (Last 24 hours) at 02/02/17 1146 Last data filed at 02/02/17 0600  Gross per 24 hour  Intake             2650 ml  Output                0 ml  Net             2650 ml   Filed Weights   01/31/17 2210 02/01/17 0300  Weight: 64.9 kg (143 lb) 65.2 kg (143 lb 11.8 oz)    Exam:   General:  Still dry heaving, does not look comfortable  Cardiovascular: RRR  Respiratory: CTABL  Abdomen: diffuse mild tender, no guarding, no rebound, positive BS  Musculoskeletal: No Edema  Neuro: aaox3  Data Reviewed: Basic Metabolic Panel:  Recent Labs Lab 01/31/17 2223 02/01/17 0542 02/02/17 0451  NA 142 143 143  K 3.8 3.5 2.5*  CL 103 113* 117*  CO2 21* 22 19*  GLUCOSE 220* 160* 142*  BUN 22* 18 16  CREATININE 1.21* 1.15* 1.18*  CALCIUM 9.2 7.5* 7.2*  MG  --   --  1.5*   Liver Function Tests:  Recent Labs Lab 01/31/17 2223  AST 27  ALT 14  ALKPHOS 105  BILITOT 0.7  PROT 7.6  ALBUMIN 3.7    Recent Labs Lab 01/31/17  2223  LIPASE 28   No results for input(s): AMMONIA in the last 168 hours. CBC:  Recent Labs Lab 01/31/17 2223 02/01/17 0542 02/02/17 0451  WBC 20.9* 14.5* 10.5  NEUTROABS  --  13.4* 8.1*  HGB 15.0 12.8 13.4  HCT 44.7 39.7 40.9  MCV 87.3 88.6 87.2  PLT 341 278 270   Cardiac Enzymes:   No results for input(s): CKTOTAL, CKMB, CKMBINDEX, TROPONINI in the last 168 hours. BNP (last 3 results) No results for input(s): BNP in the last 8760 hours.  ProBNP (last 3 results) No results for input(s): PROBNP in the last 8760 hours.  CBG:  Recent Labs Lab 02/01/17 0801 02/02/17 0751  GLUCAP 152* 135*    Recent Results (from the past 240 hour(s))  Urine culture     Status: None   Collection Time: 02/01/17  3:06 AM  Result Value  Ref Range Status   Specimen Description URINE, RANDOM  Final   Special Requests NONE  Final   Culture   Final    NO GROWTH Performed at [facility], [address] [place], [place] [address]    Report Status 02/02/2017 FINAL  Final  C difficile quick scan w PCR reflex     Status: None   Collection Time: 02/01/17  6:54 AM  Result Value Ref Range Status   C Diff antigen NEGATIVE NEGATIVE Final   C Diff toxin NEGATIVE NEGATIVE Final   C Diff interpretation No C. difficile detected.  Final     Studies: No results found.  Scheduled Meds: . aspirin  81 mg Oral Daily  . budesonide (PULMICORT) nebulizer solution  1 mg Nebulization BID  . chlorhexidine  15 mL Mouth Rinse BID  . enoxaparin (LOVENOX) injection  40 mg Subcutaneous Q24H  . FLUoxetine  40 mg Oral Daily  . LORazepam  0.5 mg Oral Daily  . mouth rinse  15 mL Mouth Rinse q12n4p  . mirtazapine  15 mg Oral QHS  . montelukast  10 mg Oral Daily  . sodium chloride flush  3 mL Intravenous Q12H    Continuous Infusions: . lactated ringers with kcl 75 mL/hr at 02/02/17 1015  . piperacillin-tazobactam (ZOSYN)  IV 3.375 g (02/02/17 1017)     Time spent>  Purl Claytor MD, PhD  [facility] Pager [phone]. If 7PM-7AM, please contact night-coverage at [email or web address], password [facility] 02/02/2017, 11:46 AM  LOS: 1 day

## 2017-02-02 NOTE — Progress Notes (Signed)
Notified MD Xu concerning GI panel positive for noravirus  Stanford BreedBracey, [place] Parco N 4:46 PM 02-02-2017

## 2017-02-03 LAB — BASIC METABOLIC PANEL
ANION GAP: 6 (ref 5–15)
BUN: 11 mg/dL (ref 6–20)
CHLORIDE: 118 mmol/L — AB (ref 101–111)
CO2: 21 mmol/L — ABNORMAL LOW (ref 22–32)
Calcium: 8.2 mg/dL — ABNORMAL LOW (ref 8.9–10.3)
Creatinine, Ser: 1.09 mg/dL — ABNORMAL HIGH (ref 0.44–1.00)
GFR calc Af Amer: 56 mL/min — ABNORMAL LOW (ref >60)
GFR calc non Af Amer: 48 mL/min — ABNORMAL LOW (ref >60)
Glucose, Bld: 141 mg/dL — ABNORMAL HIGH (ref 65–99)
POTASSIUM: 4 mmol/L (ref 3.5–5.1)
Sodium: 145 mmol/L (ref 135–145)

## 2017-02-03 LAB — GLUCOSE, CAPILLARY: GLUCOSE-CAPILLARY: 114 mg/dL — AB (ref 65–99)

## 2017-02-03 LAB — MAGNESIUM: MAGNESIUM: 2.1 mg/dL (ref 1.7–2.4)

## 2017-02-03 NOTE — Discharge Summary (Signed)
Discharge Summary  Mariah MatarBetty S King ZOX:[id number] DOB: [birth year]  PCP: Patient, No Pcp Per  Admit date: 01/31/2017 Discharge date: 02/03/2017  Time spent: <6430mins  Recommendations for Outpatient Follow-up:  1. F/u with PMD within a week  for hospital discharge follow up, repeat cbc/bmp at follow up  Discharge Diagnoses:  Active Hospital Problems   Diagnosis Date Noted  . Nausea vomiting and diarrhea 02/01/2017  . AKI (acute kidney injury) (HCC) 02/01/2017  . Sepsis (HCC) 02/01/2017  . Abdominal pain 02/01/2017  . Asthma   . Depression     Resolved Hospital Problems   Diagnosis Date Noted Date Resolved  No resolved problems to display.    Discharge Condition: stable  Diet recommendation: Regular diet  Filed Weights   01/31/17 2210 02/01/17 0300  Weight: 64.9 kg (143 lb) 65.2 kg (143 lb 11.8 oz)    History of present illness:  Patient coming from:  The patient is coming from home.  At baseline, pt is independent for most of ADL.  Chief Complaint: Nausea, vomiting, diarrhea and abdominal pain  HPI: Mariah King is a 76 y.o. female with medical history significant of asthma, depression and anxiety, who presents with nausea, vomiting, diarrhea and abdominal pain.  Patient states that her symptoms suddenly started at about 6 PM. She vomited more than 10 times without blood in the vomitus. She has watery stool bowel movement for more than 10 times. She has abdominal pain, which is located in the middle abdomen, constant, cramping pain, 10 out of 10 in severity, nonradiating. She also has fever and chills. No hematochezia. Patient denies chest pain, shortness breath, cough, symptoms of UTI or unilateral weakness. Patient did not eat unusual foods, no recent traveling.  ED Course: pt was found to have WBC 20.9, lactic acid 5.64, acute renal injury with creatinine 1.21, temperature 103.3, tachycardia, tachypnea, oxygen satting 90% on room air, negative chest x-ray. Pending UA. CT  abdomen/pelvis is not impressive. Patient is admitted to telemetry bed as inpatient.  Hospital Course:  Principal Problem:   Nausea vomiting and diarrhea Active Problems:   Asthma   AKI (acute kidney injury) (HCC)   Sepsis (HCC)   Abdominal pain   Depression   Nausea vomiting and diarrhea, Abdominal pain and sepsis: --Patient meets criteria for sepsis on admission with leukocytosis, elevated lactate acid 5.64, fever, tachycardia and tachypnea.   --C. difficile negative, GI prc panel +norovirus, -- ct ab/pel does show diffuse diverticula but not acute infection - Blood culture x 2 no growth, ua no infection, cxr no acute findings - IV vancomycin and Zosyn were ordered by EDP. vancomycin d/ced on 6/3 , d/c Zosyn on 6/4 after gi prc panel + norovirus -  Improving, fever resolved, lactic acid normalized, wbc normalized. Symptom resolved, tolerating po. Discharge home.   AKI: -Likely due to prerenal secondary to dehydration,  -ua no infection -improved, cr peaked at 1.31, cr 1.09 at discharge.  Continue encourage oral intake.  Hypokalemia/hypomagnesemia: replaced  Qtc prolongation Qtc on admission 505, repeat Qtc on 6/4 am, Qtc 466 after correcting electrolyte abnormalities  Depression and anxiety:Stable, no suicidal or homicidal ideations. -Continue home medications: Prozac, Ativan, Remeron  Asthma: stable -When necessary albuterol nebulizers -Singulair, -Asmanex inhaler   DVT ppx while in the hospital: SQ Lovenox Code Status:Partial code (I discussed with the patient, and explained the meaning of CODE STATUS, patient wants to be partial code, OK for CPR, but no intubation). Family Communication: patient  Disposition Plan:  discharge back  to previous home environment  Consults called:none   Procedures:  none  Antibiotics:  vanc x1  Zosyn from admission to 6/4   Discharge Exam: BP (!) 141/61 (BP Location: Right Arm)   Pulse 65   Temp 97.4  F (36.3 C) (Oral)   Resp 20   Ht 5\' 4"  (1.626 m)   Wt 65.2 kg (143 lb 11.8 oz)   SpO2 99%   BMI 24.67 kg/m   General: much better, pain has resolved, no n/v, no diarrhea, no fever Cardiovascular: RRR Respiratory: CTABL Ab: soft, nontender, +BS Extremity: no edema  Discharge Instructions You were cared for by a hospitalist during your hospital stay. If you have any questions about your discharge medications or the care you received while you were in the hospital after you are discharged, you can call the unit and asked to speak with the hospitalist on call if the hospitalist that took care of you is not available. Once you are discharged, your primary care physician will handle any further medical issues. Please note that NO REFILLS for any discharge medications will be authorized once you are discharged, as it is imperative that you return to your primary care physician (or establish a relationship with a primary care physician if you do not have one) for your aftercare needs so that they can reassess your need for medications and monitor your lab values.  Discharge Instructions    Diet general    Complete by:  As directed    ADVANCE DIET AS TOLERATED   Increase activity slowly    Complete by:  As directed      Allergies as of 02/03/2017      Reactions   Sulfa Antibiotics Other (See Comments)   Childhood, unknown reaction      Medication List    TAKE these medications   aspirin 81 MG chewable tablet Chew 81 mg by mouth every morning.   FLUoxetine 40 MG capsule Commonly known as:  PROZAC Take 40 mg by mouth every morning.   LORazepam 0.5 MG tablet Commonly known as:  ATIVAN Take 0.5 mg by mouth every morning.   mirtazapine 15 MG tablet Commonly known as:  REMERON Take 15 mg by mouth at bedtime.   mometasone 220 MCG/INH inhaler Commonly known as:  ASMANEX Inhale 2 puffs into the lungs 2 (two) times daily.   montelukast 10 MG tablet Commonly known as:   SINGULAIR Take 10 mg by mouth every morning.   Vitamin D (Ergocalciferol) 50000 units Caps capsule Commonly known as:  DRISDOL Take 50,000 Units by mouth every 7 (seven) days.      Allergies  Allergen Reactions  . Sulfa Antibiotics Other (See Comments)    Childhood, unknown reaction   Follow-up Information    FOLLOW UP WITH PRIMARY CARE PHYSICIAN Follow up in 1 week(s).   Why:  HOSPITAL DISCAHRGE FOLLOW UP, REPEAT CBC/BMP AT FOLLOW UP.           The results of significant diagnostics from this hospitalization (including imaging, microbiology, ancillary and laboratory) are listed below for reference.    Significant Diagnostic Studies: Dg Chest 2 View  Result Date: 02/01/2017 CLINICAL DATA:  Nausea, vomiting and diarrhea.  Possible sepsis. EXAM: CHEST  2 VIEW COMPARISON:  None. FINDINGS: The heart size and mediastinal contours are within normal limits. Both lungs are clear. The visualized skeletal structures are unremarkable. IMPRESSION: No active cardiopulmonary disease. Electronically Signed   By: Deatra Robinson M.D.   On: 02/01/2017 00:21  Ct Abdomen Pelvis W Contrast  Result Date: 02/01/2017 CLINICAL DATA:  Nausea, vomiting and diarrhea EXAM: CT ABDOMEN AND PELVIS WITH CONTRAST TECHNIQUE: Multidetector CT imaging of the abdomen and pelvis was performed using the standard protocol following bolus administration of intravenous contrast. CONTRAST:  80mL ISOVUE-300 IOPAMIDOL (ISOVUE-300) INJECTION 61% COMPARISON:  None. FINDINGS: Lower chest: No pulmonary nodules. No visible pleural or pericardial effusion. Hepatobiliary: Normal hepatic size and contours without focal liver lesion. No perihepatic ascites. No intra- or extrahepatic biliary dilatation. Normal gallbladder. Pancreas: Normal pancreatic contours and enhancement. No peripancreatic fluid collection or pancreatic ductal dilatation. Spleen: Normal. Adrenals/Urinary Tract: Normal adrenal glands. There are bilateral renal sinus  cysts that measure up to 2.2 cm. Stomach/Bowel: There is no hiatal hernia. The stomach and duodenum are normal. There is no dilated small bowel or enteric inflammation. There is diffuse colonic diverticulosis without acute inflammation. The appendix is normal. Vascular/Lymphatic: There is atherosclerotic calcification of the non aneurysmal abdominal aorta. No abdominal or pelvic adenopathy. Reproductive: There are multiple small uterine masses that are likely fibroids. No adnexal mass. Musculoskeletal: Multilevel lower lumbar facet arthrosis and osteophytosis. No spinal canal stenosis. Normal visualized extrathoracic and extraperitoneal soft tissues. Other: No contributory non-categorized findings. IMPRESSION: 1. No acute abnormality of the abdomen or pelvis. 2. Diffuse colonic diverticulosis without findings of acute diverticulitis. 3. Incidental findings include calcific aortic atherosclerosis and multiple small uterine fibroids. Electronically Signed   By: Deatra Robinson M.D.   On: 02/01/2017 00:18    Microbiology: Recent Results (from the past 240 hour(s))  Blood Culture (routine x 2)     Status: None (Preliminary result)   Collection Time: 01/31/17 10:50 PM  Result Value Ref Range Status   Specimen Description BLOOD LEFT ANTECUBITAL  Final   Special Requests   Final    BOTTLES DRAWN AEROBIC AND ANAEROBIC Blood Culture adequate volume   Culture   Final    NO GROWTH 2 DAYS Performed at [facility], [address] [place], [place] [address]    Report Status PENDING  Incomplete  Blood Culture (routine x 2)     Status: None (Preliminary result)   Collection Time: 01/31/17 10:52 PM  Result Value Ref Range Status   Specimen Description BLOOD LEFT HAND  Final   Special Requests   Final    BOTTLES DRAWN AEROBIC AND ANAEROBIC Blood Culture adequate volume   Culture   Final    NO GROWTH 2 DAYS Performed at [facility], [address], [place], [place] [address]    Report Status  PENDING  Incomplete  Urine culture     Status: None   Collection Time: 02/01/17  3:06 AM  Result Value Ref Range Status   Specimen Description URINE, RANDOM  Final   Special Requests NONE  Final   Culture   Final    NO GROWTH Performed at [facility], [address], [place], [place] [address]    Report Status 02/02/2017 FINAL  Final  Gastrointestinal Panel by PCR , Stool     Status: Abnormal   Collection Time: 02/01/17  6:54 AM  Result Value Ref Range Status   Campylobacter species NOT DETECTED NOT DETECTED Final   Plesimonas shigelloides NOT DETECTED NOT DETECTED Final   Salmonella species NOT DETECTED NOT DETECTED Final   Yersinia enterocolitica NOT DETECTED NOT DETECTED Final   Vibrio species NOT DETECTED NOT DETECTED Final   Vibrio cholerae NOT DETECTED NOT DETECTED Final   Enteroaggregative E  coli (EAEC) NOT DETECTED NOT DETECTED Final   Enteropathogenic E coli (EPEC) NOT DETECTED NOT DETECTED Final   Enterotoxigenic E coli (ETEC) NOT DETECTED NOT DETECTED Final   Shiga like toxin producing E coli (STEC) NOT DETECTED NOT DETECTED Final   Shigella/Enteroinvasive E coli (EIEC) NOT DETECTED NOT DETECTED Final   Cryptosporidium NOT DETECTED NOT DETECTED Final   Cyclospora cayetanensis NOT DETECTED NOT DETECTED Final   Entamoeba histolytica NOT DETECTED NOT DETECTED Final   Giardia lamblia NOT DETECTED NOT DETECTED Final   Adenovirus F40/41 NOT DETECTED NOT DETECTED Final   Astrovirus NOT DETECTED NOT DETECTED Final   Norovirus GI/GII DETECTED (A) NOT DETECTED Final    Comment: RESULT CALLED TO, READ BACK BY AND VERIFIED WITH:  NATHAN THOMPSON AT 1517 02/02/17 SDR RESULT CALLED TO, READ BACK BY AND VERIFIED WITH: K.BRACEY AT 1642 ON 02/02/17 BY N.THOMPSON    Rotavirus A NOT DETECTED NOT DETECTED Final   Sapovirus (I, II, IV, and V) NOT DETECTED NOT DETECTED Final  C difficile quick scan w PCR reflex     Status: None   Collection Time: 02/01/17  6:54 AM  Result Value Ref  Range Status   C Diff antigen NEGATIVE NEGATIVE Final   C Diff toxin NEGATIVE NEGATIVE Final   C Diff interpretation No C. difficile detected.  Final     Labs: Basic Metabolic Panel:  Recent Labs Lab 01/31/17 2223 02/01/17 0542 02/02/17 0451 02/02/17 1554 02/03/17 0617  NA 142 143 143 145 145  K 3.8 3.5 2.5* 3.4* 4.0  CL 103 113* 117* 119* 118*  CO2 21* 22 19* 18* 21*  GLUCOSE 220* 160* 142* 147* 141*  BUN 22* 18 16 15 11   CREATININE 1.21* 1.15* 1.18* 1.31* 1.09*  CALCIUM 9.2 7.5* 7.2* 7.4* 8.2*  MG  --   --  1.5* 2.6* 2.1   Liver Function Tests:  Recent Labs Lab 01/31/17 2223  AST 27  ALT 14  ALKPHOS 105  BILITOT 0.7  PROT 7.6  ALBUMIN 3.7    Recent Labs Lab 01/31/17 2223  LIPASE 28   No results for input(s): AMMONIA in the last 168 hours. CBC:  Recent Labs Lab 01/31/17 2223 02/01/17 0542 02/02/17 0451  WBC 20.9* 14.5* 10.5  NEUTROABS  --  13.4* 8.1*  HGB 15.0 12.8 13.4  HCT 44.7 39.7 40.9  MCV 87.3 88.6 87.2  PLT 341 278 270   Cardiac Enzymes: No results for input(s): CKTOTAL, CKMB, CKMBINDEX, TROPONINI in the last 168 hours. BNP: BNP (last 3 results) No results for input(s): BNP in the last 8760 hours.  ProBNP (last 3 results) No results for input(s): PROBNP in the last 8760 hours.  CBG:  Recent Labs Lab 02/01/17 0801 02/02/17 0751 02/03/17 0737  GLUCAP 152* 135* 114*       Signed:  Nkechi Linehan MD, PhD  [facility] 02/03/2017, 3:40 PM

## 2017-02-03 NOTE — Care Management Note (Signed)
Case Management Note  Patient Details  Name: Mariah King MRN: [id number] Date of Birth: [birth year]  Subjective/Objective: Noted PT was consulted but they were unable to assess for needs. CM referral for HHC if needed. Patient has already d/c.                   Action/Plan:d/c home.   Expected Discharge Date:  02/03/17               Expected Discharge Plan:  Home/Self Care  In-House Referral:     Discharge planning Services  CM Consult  Post Acute Care Choice:    Choice offered to:     DME Arranged:    DME Agency:     HH Arranged:    HH Agency:     Status of Service:     If discussed at [employer] Meetings, dates discussed:    Additional Comments:  Lanier ClamMahabir, [name], RN 02/03/2017, 12:15 PM

## 2017-02-03 NOTE — Progress Notes (Signed)
PT Cancellation Note  Patient Details Name: Mariah King MRN: [id number] DOB: Mar 05, 1941   Cancelled Treatment:    Reason Eval/Treat Not Completed:  Pt discharged prior to PT eval   Rebeca AlertJannie Brodie Correll, MPT Pager: [phone]

## 2017-02-04 LAB — HEMOGLOBIN A1C
HEMOGLOBIN A1C: 7 % — AB (ref 4.8–5.6)
MEAN PLASMA GLUCOSE: 154 mg/dL

## 2017-02-06 LAB — BLOOD CULTURE ID PANEL (REFLEXED)
ACINETOBACTER BAUMANNII: NOT DETECTED
CANDIDA ALBICANS: NOT DETECTED
CANDIDA GLABRATA: NOT DETECTED
CANDIDA TROPICALIS: NOT DETECTED
Candida krusei: NOT DETECTED
Candida parapsilosis: NOT DETECTED
ENTEROBACTERIACEAE SPECIES: NOT DETECTED
Enterobacter cloacae complex: NOT DETECTED
Enterococcus species: NOT DETECTED
Escherichia coli: NOT DETECTED
Haemophilus influenzae: NOT DETECTED
KLEBSIELLA PNEUMONIAE: NOT DETECTED
Klebsiella oxytoca: NOT DETECTED
Listeria monocytogenes: NOT DETECTED
NEISSERIA MENINGITIDIS: NOT DETECTED
Proteus species: NOT DETECTED
Pseudomonas aeruginosa: NOT DETECTED
STREPTOCOCCUS AGALACTIAE: NOT DETECTED
Serratia marcescens: NOT DETECTED
Staphylococcus aureus (BCID): NOT DETECTED
Staphylococcus species: NOT DETECTED
Streptococcus pneumoniae: NOT DETECTED
Streptococcus pyogenes: NOT DETECTED
Streptococcus species: NOT DETECTED

## 2017-02-06 LAB — CULTURE, BLOOD (ROUTINE X 2)
Culture: NO GROWTH
Culture: NO GROWTH
SPECIAL REQUESTS: ADEQUATE
SPECIAL REQUESTS: ADEQUATE

## 2017-02-10 DIAGNOSIS — A4189 Other specified sepsis: Secondary | ICD-10-CM | POA: Diagnosis not present

## 2017-02-10 DIAGNOSIS — R531 Weakness: Secondary | ICD-10-CM | POA: Diagnosis not present

## 2017-03-04 DIAGNOSIS — E119 Type 2 diabetes mellitus without complications: Secondary | ICD-10-CM | POA: Diagnosis not present

## 2017-03-04 DIAGNOSIS — A4189 Other specified sepsis: Secondary | ICD-10-CM | POA: Diagnosis not present

## 2017-03-04 DIAGNOSIS — R531 Weakness: Secondary | ICD-10-CM | POA: Diagnosis not present

## 2017-03-04 DIAGNOSIS — K219 Gastro-esophageal reflux disease without esophagitis: Secondary | ICD-10-CM | POA: Diagnosis not present

## 2017-10-12 DIAGNOSIS — M810 Age-related osteoporosis without current pathological fracture: Secondary | ICD-10-CM | POA: Diagnosis not present

## 2017-10-12 DIAGNOSIS — E119 Type 2 diabetes mellitus without complications: Secondary | ICD-10-CM | POA: Diagnosis not present

## 2017-10-12 DIAGNOSIS — E7849 Other hyperlipidemia: Secondary | ICD-10-CM | POA: Diagnosis not present

## 2017-10-19 DIAGNOSIS — I10 Essential (primary) hypertension: Secondary | ICD-10-CM | POA: Diagnosis not present

## 2017-10-19 DIAGNOSIS — Z0000 Encounter for general adult medical examination without abnormal findings: Secondary | ICD-10-CM | POA: Diagnosis not present

## 2017-10-19 DIAGNOSIS — E119 Type 2 diabetes mellitus without complications: Secondary | ICD-10-CM | POA: Diagnosis not present

## 2017-10-19 DIAGNOSIS — J45998 Other asthma: Secondary | ICD-10-CM | POA: Diagnosis not present

## 2017-10-22 DIAGNOSIS — Z1212 Encounter for screening for malignant neoplasm of rectum: Secondary | ICD-10-CM | POA: Diagnosis not present

## 2017-11-24 DIAGNOSIS — H5213 Myopia, bilateral: Secondary | ICD-10-CM | POA: Diagnosis not present

## 2017-11-27 DIAGNOSIS — J449 Chronic obstructive pulmonary disease, unspecified: Secondary | ICD-10-CM | POA: Diagnosis not present

## 2017-11-27 DIAGNOSIS — R05 Cough: Secondary | ICD-10-CM | POA: Diagnosis not present

## 2017-11-27 DIAGNOSIS — Z6823 Body mass index (BMI) 23.0-23.9, adult: Secondary | ICD-10-CM | POA: Diagnosis not present

## 2017-11-27 DIAGNOSIS — I10 Essential (primary) hypertension: Secondary | ICD-10-CM | POA: Diagnosis not present

## 2017-12-11 DIAGNOSIS — R05 Cough: Secondary | ICD-10-CM | POA: Diagnosis not present

## 2017-12-11 DIAGNOSIS — J449 Chronic obstructive pulmonary disease, unspecified: Secondary | ICD-10-CM | POA: Diagnosis not present

## 2017-12-11 DIAGNOSIS — J45909 Unspecified asthma, uncomplicated: Secondary | ICD-10-CM | POA: Diagnosis not present

## 2017-12-11 DIAGNOSIS — J302 Other seasonal allergic rhinitis: Secondary | ICD-10-CM | POA: Diagnosis not present

## 2017-12-30 DIAGNOSIS — H2511 Age-related nuclear cataract, right eye: Secondary | ICD-10-CM | POA: Diagnosis not present

## 2017-12-30 DIAGNOSIS — H25811 Combined forms of age-related cataract, right eye: Secondary | ICD-10-CM | POA: Diagnosis not present

## 2018-05-04 ENCOUNTER — Ambulatory Visit: Payer: Medicare Other | Admitting: Internal Medicine

## 2018-05-04 ENCOUNTER — Encounter: Payer: Self-pay | Admitting: Internal Medicine

## 2018-05-04 ENCOUNTER — Other Ambulatory Visit (INDEPENDENT_AMBULATORY_CARE_PROVIDER_SITE_OTHER): Payer: Medicare Other

## 2018-05-04 VITALS — BP 126/80 | HR 80 | Ht 64.0 in | Wt 139.0 lb

## 2018-05-04 DIAGNOSIS — J45991 Cough variant asthma: Secondary | ICD-10-CM | POA: Diagnosis not present

## 2018-05-04 LAB — CBC WITH DIFFERENTIAL/PLATELET
BASOS ABS: 0.1 10*3/uL (ref 0.0–0.1)
Basophils Relative: 1 % (ref 0.0–3.0)
EOS ABS: 0.4 10*3/uL (ref 0.0–0.7)
Eosinophils Relative: 3.3 % (ref 0.0–5.0)
HEMATOCRIT: 44.2 % (ref 36.0–46.0)
HEMOGLOBIN: 14.5 g/dL (ref 12.0–15.0)
LYMPHS PCT: 32.6 % (ref 12.0–46.0)
Lymphs Abs: 4.3 10*3/uL — ABNORMAL HIGH (ref 0.7–4.0)
MCHC: 32.9 g/dL (ref 30.0–36.0)
MCV: 85.3 fl (ref 78.0–100.0)
Monocytes Absolute: 1 10*3/uL (ref 0.1–1.0)
Monocytes Relative: 7.3 % (ref 3.0–12.0)
Neutro Abs: 7.4 10*3/uL (ref 1.4–7.7)
Neutrophils Relative %: 55.8 % (ref 43.0–77.0)
PLATELETS: 336 10*3/uL (ref 150.0–400.0)
RBC: 5.18 Mil/uL — AB (ref 3.87–5.11)
RDW: 15.8 % — ABNORMAL HIGH (ref 11.5–15.5)
WBC: 13.2 10*3/uL — ABNORMAL HIGH (ref 4.0–10.5)

## 2018-05-04 MED ORDER — MOMETASONE FURO-FORMOTEROL FUM 100-5 MCG/ACT IN AERO: 2.0000 | INHALATION_SPRAY | Freq: Two times a day (BID) | RESPIRATORY_TRACT | 0 refills | Status: DC

## 2018-05-04 MED ORDER — MOMETASONE FURO-FORMOTEROL FUM 100-5 MCG/ACT IN AERO: 2.0000 | INHALATION_SPRAY | Freq: Two times a day (BID) | RESPIRATORY_TRACT | 11 refills | Status: DC

## 2018-05-04 NOTE — Assessment & Plan Note (Addendum)
FENO 05/04/2018  =   Could not perform Spirometry 05/04/2018  FEV1 1.69 (83%)  Ratio 80 s curvature after am asmanex 200 - Allergy profile 05/04/2018 >  Eos 0. /  IgE   - 05/04/2018  After extensive coaching inhaler device,  effectiveness =    50% try change asmanex to dulera 100 2bid  And gerd diet then add ppi bid if not improving p one week of dulera 100     The most common causes of chronic cough in immunocompetent adults include the following: upper airway cough syndrome (UACS), previously referred to as postnasal drip syndrome (PNDS), which is caused by variety of rhinosinus conditions; (2) asthma; (3) GERD; (4) chronic bronchitis from cigarette smoking or other inhaled environmental irritants; (5) nonasthmatic eosinophilic bronchitis; and (6) bronchiectasis.   These conditions, singly or in combination, have accounted for up to 94% of the causes of chronic cough in prospective studies.   Other conditions have constituted no >6% of the causes in prospective studies These have included bronchogenic carcinoma, chronic interstitial pneumonia, sarcoidosis, left ventricular failure, ACEI-induced cough, and aspiration from a condition associated with pharyngeal dysfunction.    Chronic cough is often simultaneously caused by more than one condition. A single cause has been found from 38 to 82% of the time, multiple causes from 18 to 62%. Multiply caused cough has been the result of three diseases up to 42% of the time.    The lack of clear response with prednisone and ICS  Favors refactory cough variant asthma vs uacs masquerading as asthma - Upper airway cough syndrome (previously labeled PNDS),  is so named because it's frequently impossible to sort out how much is  CR/sinusitis with freq throat clearing (which can be related to primary GERD)   vs  causing  secondary (" extra esophageal")  GERD from wide swings in gastric pressure that occur with throat clearing, often  promoting self use of mint and  menthol lozenges that reduce the lower esophageal sphincter tone and exacerbate the problem further in a cyclical fashion.   These are the same pts (now being labeled as having "irritable larynx syndrome" by some cough centers) who not infrequently have a history of having failed to tolerate ace inhibitors,  dry powder inhalers like anoro or even high dose ics like asmanex 200)  or biphosphonates or report having atypical/extraesophageal reflux symptoms that don't respond to standard doses of PPI  and are easily confused as having aecopd or asthma flares by even experienced allergists/ pulmonologists (myself included).    rec max diet then add ppi bid ac if not improving on above rx and f/u in 4 weeks with all meds in hand using a trust but verify approach to confirm accurate Medication  Reconciliation The principal here is that until we are certain that the  patients are doing what we've asked, it makes no sense to ask them to do more.      Total time devoted to counseling  > 50 % of initial 60 min office visit:  review case with pt/ discussion of options/alternatives/ personally creating written customized instructions  in presence of pt  then going over those specific  Instructions directly with the pt including how to use all of the meds but in particular covering each new medication in detail and the difference between the maintenance= "automatic" meds and the prns using an action plan format for the latter (If this problem/symptom => do that organization reading Left to right).  Please  see AVS from this visit for a full list of these instructions which I personally wrote for this pt and  are unique to this visit.   See device teaching which extended face to face time for this visit

## 2018-05-04 NOTE — Patient Instructions (Addendum)
Plan A = Automatic = continue montelukast but stop asmanex and start dulera 100 Take 2 puffs first thing in am and then another 2 puffs about 12 hours later.   Work on inhaler technique:  relax and gently blow all the way out then take a nice smooth deep breath back in, triggering the inhaler at same time you start breathing in.  Hold for up to 5 seconds if you can. Blow out thru nose. Rinse and gargle with water when done      Plan B = Backup Only use your albuterol as a rescue medication to be used if you can't catch your breath by resting or doing a relaxed purse lip breathing pattern.  - The less you use it, the better it will work when you need it. - Ok to use the inhaler up to 2 puffs  every 4 hours if you must but call for appointment if use goes up over your usual need - Don't leave home without it !!  (think of it like the spare tire for your car)     GERD (REFLUX)  is an extremely common cause of respiratory symptoms just like yours , many times with no obvious heartburn at all.    It can be treated with medication, but also with lifestyle changes including elevation of the head of your bed (ideally with 6 inch  bed blocks),  Smoking cessation, avoidance of late meals, excessive alcohol, and avoid fatty foods, chocolate, peppermint, colas, red wine, and acidic juices such as orange juice.  NO MINT OR MENTHOL PRODUCTS SO NO COUGH DROPS   USE SUGARLESS CANDY INSTEAD (Jolley ranchers or Stover's or Life Savers) or even ice chips will also do - the key is to swallow to prevent all throat clearing. NO OIL BASED VITAMINS - use powdered substitutes.    If cough not better in a week try adding prilosec otc 20 mg Take 30- 60 min before your first and last meals of the day to the above regimen    Please remember to go to the lab department downstairs in the basement  for your tests - we will call you with the results when they are available.     Please schedule a follow up office visit in  4 weeks, sooner if needed  with all medications /inhalers/ solutions in hand so we can verify exactly what you are taking. This includes all medications from all doctors and over the counters

## 2018-05-04 NOTE — Progress Notes (Signed)
Mariah King, female    DOB: 1941/05/15,    MRN: [id number]    Brief patient profile:  89 yowf quit smoking 1960 never regular/ heavy no problems until age of 46 around early 2000s wheezing/sob rx flovent helped some then asmanex/singulair  which helped some but [place] and has avoided it and was given saba for prn use which may have helped some and since April 2019 continues to cough late afternoon and before bed but not noct so referred to pulmonary clinic 05/04/2018 by Dr  Eloise Harman.     History of Present Illness  05/04/2018  Pulmonary consultation/ Mariah King  Chief Complaint  Patient presents with  . Pulmonary Consult    Referred by Dr. Jarome Matin. Pt c/o cough since April 2019. Cough is occ prod with yellow sputum, worse early evening and night but she sleeps okay.   Dyspnea:  Only with exertion  Cough:  no change with abx, worse with anoro, no change with flonase, ? Some better with prednisone (office records say much better, but she doesn't recall it that way) - using lots of breath mints/ mucus usually <  A tsp  Sleep: fine/ two pillows s am flare SABA use: last used 2 m prior to OV     No obvious day to day or daytime variability or assoc   mucus plugs or hemoptysis or cp or chest tightness, subjective wheeze or overt sinus or hb symptoms.   Sleeping as above without nocturnal  or early am exacerbation  of respiratory  c/o's or need for noct saba. Also denies any obvious fluctuation of symptoms with weather or environmental changes or other aggravating or alleviating factors except as outlined above   No unusual exposure hx or h/o childhood pna/ asthma or knowledge of premature birth.  Current Allergies, Complete Past Medical History, Past Surgical History, Family History, and Social History were reviewed in [employer] [facility] record.  ROS  The following are not active complaints unless bolded Hoarseness, sore  throat, dysphagia, dental problems, itching, sneezing,  nasal congestion or discharge of excess mucus or purulent secretions, ear ache,   fever, chills, sweats, unintended wt loss or wt gain, classically pleuritic or exertional cp,  orthopnea pnd or arm/hand swelling  or leg swelling, presyncope, palpitations, abdominal pain, anorexia, nausea, vomiting, diarrhea  or change in bowel habits or change in bladder habits, change in stools or change in urine, dysuria, hematuria,  rash, arthralgias, visual complaints, headache, numbness, weakness or ataxia or problems with walking or coordination,  change in mood or  memory.           Past Medical History:  Diagnosis Date  . Asthma   . Depression     Outpatient Medications Prior to Visit  Medication Sig Dispense Refill  . FLUoxetine (PROZAC) 40 MG capsule Take 40 mg by mouth every morning.     Marland Kitchen LORazepam (ATIVAN) 0.5 MG tablet Take 0.5 mg by mouth every morning.     . mirtazapine (REMERON) 15 MG tablet Take 15 mg by mouth at bedtime.    . mometasone (ASMANEX) 220 MCG/INH inhaler Inhale 2 puffs into the lungs 2 (two) times daily.    . montelukast (SINGULAIR) 10 MG tablet Take 10 mg by mouth every morning.     . Vitamin D, Ergocalciferol, (DRISDOL) 50000 units CAPS capsule Take 50,000 Units by mouth every 7 (seven) days.    Marland Kitchen aspirin 81 MG chewable  tablet Chew 81 mg by mouth every morning.      No facility-administered medications prior to visit.             Objective:     BP 126/80 (BP Location: Left Arm, Cuff Size: Normal)   Pulse 80   Ht 5\' 4"  (1.626 m)   Wt 139 lb (63 kg)   SpO2 93%   BMI 23.86 kg/m   SpO2: 93 %    amb wf with harsh cough   HEENT: nl dentition, turbinates bilaterally, and oropharynx. Nl external ear canals without cough reflex   NECK :  without JVD/Nodes/TM/ nl carotid upstrokes bilaterally   LUNGS: no acc muscle use,  Nl contour chest which is clear to A and P bilaterally without cough on insp or exp  maneuvers   CV:  RRR  no s3 or murmur or increase in P2, and no edema   ABD:  soft and nontender with nl inspiratory excursion in the supine position. No bruits or organomegaly appreciated, bowel sounds nl  MS:  Nl gait/ ext warm without deformities, calf tenderness, cyanosis or clubbing No obvious joint restrictions   SKIN: warm and dry without lesions    NEURO:  alert, approp, nl sensorium with  no motor or cerebellar deficits apparent.     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 /  IgE   - 05/04/2018  After extensive coaching inhaler device,  effectiveness =    50% try change asmanex to dulera 100 2bid  And gerd diet then add ppi bid if not improving p one week of dulera 100     The most common causes of chronic cough in immunocompetent adults include the following: upper airway cough syndrome (UACS), previously referred to as postnasal drip syndrome (PNDS), which is caused by variety of rhinosinus conditions; (2) asthma; (3) GERD; (4) chronic bronchitis from cigarette smoking or other inhaled environmental irritants; (5) nonasthmatic eosinophilic bronchitis; and (6) bronchiectasis.   These conditions, singly or in combination, have accounted for up to 94% of the causes of chronic cough in prospective studies.   Other conditions have constituted no >6% of the causes in prospective studies These have included bronchogenic carcinoma, chronic interstitial pneumonia, sarcoidosis, left ventricular failure, ACEI-induced cough, and aspiration from a condition associated with pharyngeal dysfunction.    Chronic cough is often simultaneously caused by more than one condition. A single cause has been found from 38 to 82% of the time, multiple causes from 18 to 62%. Multiply caused cough has been  the result of three diseases up to 42% of the time.    The lack of clear response with prednisone and ICS  Favors refactory cough variant asthma vs uacs masquerading as asthma - Upper airway cough syndrome (previously labeled PNDS),  is so named because it's frequently impossible to sort out how much is  CR/sinusitis with freq throat clearing (which can be related to primary GERD)   vs  causing  secondary (" extra esophageal")  GERD from wide swings in gastric pressure that occur with throat clearing, often  promoting self use of mint and menthol lozenges that reduce the lower esophageal sphincter tone and exacerbate the problem further in a cyclical fashion.  These are the same pts (now being labeled as having "irritable larynx syndrome" by some cough centers) who not infrequently have a history of having failed to tolerate ace inhibitors,  dry powder inhalers like anoro or even high dose ics like asmanex 200)  or biphosphonates or report having atypical/extraesophageal reflux symptoms that don't respond to standard doses of PPI  and are easily confused as having aecopd or asthma flares by even experienced allergists/ pulmonologists (myself included).    rec max diet then add ppi bid ac if not improving on above rx and f/u in 4 weeks with all meds in hand using a trust but verify approach to confirm accurate Medication  Reconciliation The principal here is that until we are certain that the  patients are doing what we've asked, it makes no sense to ask them to do more.      Total time devoted to counseling  > 50 % of initial 60 min office visit:  review case with pt/ discussion of options/alternatives/ personally creating written customized instructions  in presence of pt  then going over those specific  Instructions directly with the pt including how to use all of the meds but in particular covering each new medication in detail and the difference between the maintenance= "automatic" meds and the  prns using an action plan format for the latter (If this problem/symptom => do that organization reading Left to right).  Please see AVS from this visit for a full list of these instructions which I personally wrote for this pt and  are unique to this visit.   See device teaching which extended face to face time for this visit               Sandrea Hughs, MD 05/04/2018

## 2018-05-05 LAB — RESPIRATORY ALLERGY PROFILE REGION II ~~LOC~~
Allergen, A. alternata, m6: <0.1 kU/L
Allergen, Comm Silver Birch, t9: <0.1 kU/L
Allergen, Cottonwood, t14: <0.1 kU/L
Allergen, Mulberry, t76: <0.1 kU/L
Aspergillus fumigatus, m3: <0.1 kU/L
Bermuda Grass: <0.1 kU/L
Box Elder IgE: <0.1 kU/L
CLADOSPORIUM HERBARUM (M2) IGE: <0.1 kU/L
CLASS: 0
CLASS: 0
CLASS: 0
CLASS: 0
CLASS: 0
CLASS: 0
CLASS: 0
CLASS: 0
CLASS: 0
COMMON RAGWEED (SHORT) (W1) IGE: <0.1 kU/L
Class: 0
Class: 0
Class: 0
Class: 0
Class: 0
Class: 0
Class: 0
Class: 0
Class: 0
Class: 0
Class: 0
Class: 0
Class: 0
Class: 0
Class: 0
Dog Dander: <0.1 kU/L
Elm IgE: <0.1 kU/L
IgE (Immunoglobulin E), Serum: 43 kU/L (ref <=114)
Pecan/Hickory Tree IgE: <0.1 kU/L
Rough Pigweed  IgE: <0.1 kU/L
Sheep Sorrel IgE: <0.1 kU/L

## 2018-05-05 LAB — INTERPRETATION:

## 2018-05-06 NOTE — Progress Notes (Signed)
Spoke with pt and notified of results per Dr. Wert. Pt verbalized understanding and denied any questions. 

## 2018-06-01 ENCOUNTER — Ambulatory Visit (INDEPENDENT_AMBULATORY_CARE_PROVIDER_SITE_OTHER)
Admission: RE | Admit: 2018-06-01 | Discharge: 2018-06-01 | Disposition: A | Discharge status: Home / Self Care | Payer: Medicare Other | Source: Ambulatory Visit | Attending: Internal Medicine | Admitting: Internal Medicine

## 2018-06-01 ENCOUNTER — Encounter: Payer: Self-pay | Admitting: Internal Medicine

## 2018-06-01 ENCOUNTER — Ambulatory Visit: Payer: Medicare Other | Admitting: Internal Medicine

## 2018-06-01 VITALS — BP 120/82 | HR 77 | Ht 64.0 in | Wt 138.4 lb

## 2018-06-01 DIAGNOSIS — J45991 Cough variant asthma: Secondary | ICD-10-CM

## 2018-06-01 DIAGNOSIS — Z23 Encounter for immunization: Secondary | ICD-10-CM | POA: Diagnosis not present

## 2018-06-01 DIAGNOSIS — R05 Cough: Secondary | ICD-10-CM | POA: Diagnosis not present

## 2018-06-01 DIAGNOSIS — R0602 Shortness of breath: Secondary | ICD-10-CM | POA: Diagnosis not present

## 2018-06-01 MED ORDER — PANTOPRAZOLE SODIUM 40 MG PO TBEC: DELAYED_RELEASE_TABLET | ORAL | 2 refills | Status: DC

## 2018-06-01 MED ORDER — PREDNISONE 10 MG PO TABS
ORAL_TABLET | ORAL | 0 refills | Status: DC
Start: 2018-06-01 — End: 2018-06-22

## 2018-06-01 NOTE — Patient Instructions (Signed)
Prednisone 10 mg take  4 each am x 2 days,   2 each am x 2 days,  1 each am x 2 days and stop   Stop dulera on a trial basis   Protonix 40 mg Take 30- 60 min before your first and last meals of the day    Please remember to go to the  x-ray department downstairs in the basement  for your tests - we will call you with the results when they are available.      Please schedule a follow up office visit in 4 weeks, sooner if needed

## 2018-06-01 NOTE — Progress Notes (Signed)
Spoke with pt and notified of results per Dr. Wert. Pt verbalized understanding and denied any questions. 

## 2018-06-01 NOTE — Assessment & Plan Note (Signed)
FENO 05/04/2018  =   Could not perform Spirometry 05/04/2018  FEV1 1.69 (83%)  Ratio 80 s curvature after am asmanex 200 - Allergy profile 05/04/2018 >  Eos 0.4 /  IgE  43 RAST neg  - 05/04/2018   try change asmanex to dulera 100 2bid    - 06/01/2018  After extensive coaching inhaler device,  effectiveness =    50% from baseline < 25%  Lack of cough resolution on a verified empirical regimen (which actually has not happened yet)  could mean an alternative diagnosis, persistence of the disease state (eg sinusitis or bronchiectasis) , or inadequacy of currently available therapy (eg no medical rx available for non-acid gerd)   >>> always a concern in chronic coughers where cough induces reflux induces cough even if acid is adequately suppressed as may be the case here.     The standardized cough guidelines published in Chest by [place] in 2006 are still the best available and consist of a multiple step process (up to 12!) , not a single office visit,  and are intended  to address this problem logically,  with an alogrithm dependent on response to empiric treatment at  each progressive step  to determine a specific diagnosis with  minimal addtional testing needed. Therefore if adherence is an issue or can't be accurately verified,  it's very unlikely the standard evaluation and treatment will be successful here.    Furthermore, response to therapy (other than acute cough suppression, which should only be used short term with avoidance of narcotic containing cough syrups if possible), can be a gradual process for which the patient is not likely to  perceive immediate benefit.  Unlike going to an eye doctor where the best perscription is almost always the first one and is immediately effective, this is almost never the case in the management of chronic cough syndromes. Therefore the patient needs to commit up front to consistently adhere to recommendations  for up to 6 weeks of therapy directed at the likely  underlying problem(s) before the response can be reasonably evaluated.    RECS  -rechallenge with pred x 6 days to see if there really is a benefit while max rx for gerd this time with ppi bid ac   -Try off dulera (revere therapeutic trial) to see if symptoms flare p prednisone tapers off while on max gerd rx and if so restart immediately   - Sinus Ct/ methacholine next steps if dx remains in doubt.    I had an extended discussion with the patient reviewing all relevant studies completed to date and  lasting 15 to 20 minutes of a 25 minute visit    See device teaching which extended face to face time for this visit.  Each maintenance medication was reviewed in detail including emphasizing most importantly the difference between maintenance and prns and under what circumstances the prns are to be triggered using an action plan format that is not reflected in the computer generated alphabetically organized AVS which I have not found useful in most complex patients, especially with respiratory illnesses  Please see AVS for specific instructions unique to this visit that I personally wrote and verbalized to the the pt in detail and then reviewed with pt  by my nurse highlighting any  changes in therapy recommended at today's visit to their plan of care.

## 2018-06-01 NOTE — Progress Notes (Signed)
Mariah King, female    DOB: 04-15-1941,    MRN: [id number]    Brief patient profile:  43 yowf quit smoking 1960 never regular/ heavy no problems until age of 66 around early 2000s wheezing/sob rx flovent helped some then asmanex/singulair  which helped some but [place] and has avoided it and was given saba for prn use which may have helped some and since April 2019 continues to cough late afternoon and before bed but not noct so referred to pulmonary clinic 05/04/2018 by Dr  Eloise Harman.     History of Present Illness  05/04/2018  Pulmonary consultation/ Mariah King  Chief Complaint  Patient presents with  . Pulmonary Consult    Referred by Dr. Jarome Matin. Pt c/o cough since April 2019. Cough is occ prod with yellow sputum, worse early evening and night but she sleeps okay.   Dyspnea:  Only with exertion  Cough:  no change with abx, worse with anoro, no change with flonase, ? Some better with prednisone (office records say much better, but she doesn't recall it that way) - using lots of breath mints/ mucus usually <  A tsp  Sleep: fine/ two pillows s am flare SABA use: last used 2 m prior to OV   rec Plan A = Automatic = continue montelukast but stop asmanex and start dulera 100 Take 2 puffs first thing in am and then another 2 puffs about 12 hours later.  Work on inhaler technique:  Plan B = Backup Only use your albuterol as a rescue medication GERD diet  If cough not better in a week try adding prilosec otc 20 mg Take 30- 60 min before your first and last meals of the day to the above regimen     06/01/2018  f/u ov/[name] re: coiugh x march 2019 - non adherent with gerd rx  Chief Complaint  Patient presents with  . Follow-up    Cough is about the same. Her breathing is overall doing well. She has the albuterol inhaler but has never used it.    Dyspnea:  Not limited by breathing from desired activities  But notes if does  steep  incline>> sob Cough: sporadic/ dry -  not while asleep -  Notes up to 2-3 days / week worse p stirs in  in am but doesn't disturb sleep Sleeping: flat bed / 2 pillows SABA use: none/ not able to use dulera 100 correctly (< 25% effective)  02: none    No obvious day to day or daytime variability or assoc excess/ purulent sputum or mucus plugs or hemoptysis or cp or chest tightness, subjective wheeze or overt sinus or hb symptoms.   Sleeps as above without nocturnal    exacerbation  of respiratory  c/o's or need for noct saba. Also denies any obvious fluctuation of symptoms with weather or environmental changes or other aggravating or alleviating factors except as outlined above   No unusual exposure hx or h/o childhood pna/ asthma or knowledge of premature birth.  Current Allergies, Complete Past Medical History, Past Surgical History, Family History, and Social History were reviewed in [employer] [facility] record.  ROS  The following are not active complaints unless bolded Hoarseness, sore throat, dysphagia, dental problems, itching, sneezing,  nasal congestion or discharge of excess mucus or purulent secretions, ear ache,   fever, chills, sweats, unintended wt loss or wt gain, classically pleuritic or exertional cp,  orthopnea pnd  or arm/hand swelling  or leg swelling, presyncope, palpitations, abdominal pain, anorexia, nausea, vomiting, diarrhea  or change in bowel habits or change in bladder habits, change in stools or change in urine, dysuria, hematuria,  rash, arthralgias, visual complaints, headache, numbness, weakness or ataxia or problems with walking or coordination,  change in mood or  memory.        Current Meds  Medication Sig  . albuterol (VENTOLIN HFA) 108 (90 Base) MCG/ACT inhaler Inhale 2 puffs into the lungs every 6 (six) hours as needed for wheezing or shortness of breath.  Marland Kitchen FLUoxetine (PROZAC) 40 MG capsule Take 40 mg by mouth every morning.   Marland Kitchen LORazepam  (ATIVAN) 0.5 MG tablet Take 0.5 mg by mouth every morning.   . mirtazapine (REMERON) 15 MG tablet Take 15 mg by mouth at bedtime.  . mometasone-formoterol (DULERA) 100-5 MCG/ACT AERO Inhale 2 puffs into the lungs 2 (two) times daily.  . montelukast (SINGULAIR) 10 MG tablet Take 10 mg by mouth every morning.   . [ ]  Vitamin D, Ergocalciferol, (DRISDOL) 50000 units CAPS capsule Take 50,000 Units by mouth every 7 (seven) days.                          Objective:     amb wf nad    Wt Readings from Last 3 Encounters:  06/01/18 138 lb 6.4 oz (62.8 kg)  05/04/18 139 lb (63 kg)  02/01/17 143 lb 11.8 oz (65.2 kg)     Vital signs reviewed - Note on arrival 02 sats  94% on RA        HEENT: nl dentition, turbinates bilaterally, and oropharynx. Nl external ear canals without cough reflex   NECK :  without JVD/Nodes/TM/ nl carotid upstrokes bilaterally   LUNGS: no acc muscle use,  Nl contour chest  With mild pseudowheeze resolves with PLM  without cough on insp or exp maneuvers   CV:  RRR  no s3 or murmur or increase in P2, and no edema   ABD:  soft and nontender with nl inspiratory excursion in the supine position. No bruits or organomegaly appreciated, bowel sounds nl  MS:  Nl gait/ ext warm without deformities, calf tenderness, cyanosis or clubbing No obvious joint restrictions   SKIN: warm and dry without lesions    NEURO:  alert, approp, nl sensorium with  no motor or cerebellar deficits apparent.      CXR PA and Lateral:   06/01/2018 :    I personally reviewed images and    impression as follows:   Minimal linear atx changes in bases         Assessment

## 2018-06-21 ENCOUNTER — Telehealth: Payer: Self-pay | Admitting: Internal Medicine

## 2018-06-21 NOTE — Telephone Encounter (Signed)
Called and spoke with pt who stated she was given prednisone 10/1 and stated she was feeling well after the prednisone.  Pt stated 10/19 she woke up coughing but yesterday, 10/20 she woke up coughing more and was having chest tightness and due to that she had to use her rescue inhaler once.  Pt stated this morning, 10/21 she is still having some mild chest tightness and states she also has a dry cough.  Pt is requesting to have another Rx of prednisone to help with her symptoms. Dr. Sherene Sires, please advise for pt. Thanks!

## 2018-06-21 NOTE — Telephone Encounter (Signed)
Pt has been scheduled for acute visit on 06/22/18 with MW. Nothing further is needed.

## 2018-06-21 NOTE — Telephone Encounter (Signed)
Ov with all active meds in hand before the end of the week

## 2018-06-22 ENCOUNTER — Encounter: Payer: Self-pay | Admitting: Internal Medicine

## 2018-06-22 ENCOUNTER — Ambulatory Visit: Payer: Medicare Other | Admitting: Internal Medicine

## 2018-06-22 VITALS — BP 124/70 | HR 80 | Temp 98.3°F | Ht 64.0 in | Wt 138.8 lb

## 2018-06-22 DIAGNOSIS — J45991 Cough variant asthma: Secondary | ICD-10-CM

## 2018-06-22 MED ORDER — PREDNISONE 10 MG PO TABS: ORAL_TABLET | ORAL | 0 refills | Status: DC

## 2018-06-22 MED ORDER — MONTELUKAST SODIUM 10 MG PO TABS: 10.0000 mg | ORAL_TABLET | ORAL | Status: DC

## 2018-06-22 MED ORDER — MOMETASONE FURO-FORMOTEROL FUM 100-5 MCG/ACT IN AERO: 2.0000 | INHALATION_SPRAY | Freq: Two times a day (BID) | RESPIRATORY_TRACT | Status: DC

## 2018-06-22 NOTE — Progress Notes (Signed)
Mariah King, female    DOB: [birth year]    MRN: [id number]    Brief patient profile:  38 yowf quit smoking 1960 never regular/ heavy no problems until age of 54 around early 2000s wheezing/sob rx flovent helped some then asmanex/singulair  which helped some but [place] and has avoided it and was given saba for prn use which may have helped some and since April 2019 continues to cough late afternoon and before bed but not noct so referred to pulmonary clinic 05/04/2018 by Dr  Eloise Harman.     History of Present Illness  05/04/2018  Pulmonary consultation/ Mariah King  Chief Complaint  Patient presents with  . Pulmonary Consult    Referred by Dr. Jarome Matin. Pt c/o cough since April 2019. Cough is occ prod with yellow sputum, worse early evening and night but she sleeps okay.   Dyspnea:  Only with exertion  Cough:  no change with abx, worse with anoro, no change with flonase, ? Some better with prednisone (office records say much better, but she doesn't recall it that way) - using lots of breath mints/ mucus usually <  A tsp  Sleep: fine/ two pillows s am flare SABA use: last used 2 m prior to OV   rec Plan A = Automatic = continue montelukast but stop asmanex and start dulera 100 Take 2 puffs first thing in am and then another 2 puffs about 12 hours later.  Work on inhaler technique:  Plan B = Backup Only use your albuterol as a rescue medication GERD diet  If cough not better in a week try adding prilosec otc 20 mg Take 30- 60 min before your first and last meals of the day to the above regimen     06/01/2018  f/u ov/Mariah King re: cough x march 2019 - non adherent with gerd rx  Chief Complaint  Patient presents with  . Follow-up    Cough is about the same. Her breathing is overall doing well. She has the albuterol inhaler but has never used it.    Dyspnea:  Not limited by breathing from desired activities  But notes if does  steep incline>>  sob Cough: sporadic/ dry -  not while asleep -  Notes up to 2-3 days / week worse p stirs in  in am but doesn't disturb sleep Sleeping: flat bed / 2 pillows SABA use: none/ not able to use dulera 100 correctly (< 25% effective)  02: none  rec Prednisone 10 mg take  4 each am x 2 days,   2 each am x 2 days,  1 each am x 2 days and stop  Stop dulera on a trial basis  Protonix 40 mg Take 30- 60 min before your first and last meals of the day  Please remember to go to the  x-ray department downstairs in the basement  for your tests - we will call you with the results when they are available. 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 Acute Visit    Increased cough x 5 days- non prod. She has had some wheezing and chest tightness.     was 100% better p pred x 6 days then gradually worse esp last 5  days prior to OV  With cough/ subj wheeze/ worse doe but comfortable at rest/ did not try inhalers/ still very iffy on use   No obvious day to day or daytime variability or assoc excess/ purulent sputum or mucus plugs or hemoptysis or cp     or overt sinus or hb symptoms.    Also denies any obvious fluctuation of symptoms with weather or environmental changes or other aggravating or alleviating factors except as outlined above   No unusual exposure hx or h/o childhood pna/ asthma or knowledge of premature birth.  Current Allergies, Complete Past Medical History, Past Surgical History, Family History, and Social History were reviewed in [employer] record.  ROS  The following are not active complaints unless bolded Hoarseness, sore throat, dysphagia, dental problems, itching, sneezing,  nasal congestion or discharge of excess mucus or purulent secretions, ear ache,   fever, chills, sweats,  unintended wt loss or wt gain, classically pleuritic or exertional cp,  orthopnea pnd or arm/hand swelling  or leg swelling, presyncope, palpitations, abdominal pain, anorexia, nausea, vomiting, diarrhea  or change in bowel habits or change in bladder habits, change in stools or change in urine, dysuria, hematuria,  rash, arthralgias, visual complaints, headache, numbness, weakness or ataxia or problems with walking or coordination,  change in mood or  memory.        Current Meds  Medication Sig  . albuterol (VENTOLIN HFA) 108 (90 Base) MCG/ACT inhaler Inhale 2 puffs into the lungs every 6 (six) hours as needed for wheezing or shortness of breath.  Marland Kitchen FLUoxetine (PROZAC) 40 MG capsule Take 40 mg by mouth every morning.   Marland Kitchen LORazepam (ATIVAN) 0.5 MG tablet Take 0.5 mg by mouth every morning.   . mirtazapine (REMERON) 15 MG tablet Take 15 mg by mouth at bedtime.  . montelukast (SINGULAIR) 10 MG tablet Take 1 tablet (10 mg total) by mouth every morning.  . pantoprazole (PROTONIX) 40 MG tablet Take 30- 60 min before your first and last meals of the day  .                Objective:     amb hoarse wf nad   06/22/2018      138   06/01/18 138 lb 6.4 oz (62.8 kg)  05/04/18 139 lb (63 kg)  02/01/17 143 lb 11.8 oz (65.2 kg)      Vital signs reviewed - Note on arrival 02 sats  91% on RA         HEENT: nl dentition, turbinates bilaterally, and oropharynx. Nl external ear canals without cough reflex   NECK :  without JVD/Nodes/TM/ nl carotid upstrokes bilaterally   LUNGS: no acc muscle use,  Nl contour chest with insp/exp wheezing  bilaterally without cough on insp or exp maneuvers   CV:  RRR  no s3 or murmur or increase in P2, and no edema   ABD:  soft and nontender with nl inspiratory excursion in the supine position. No bruits or organomegaly appreciated, bowel sounds nl  MS:  Nl gait/ ext warm without deformities, calf tenderness, cyanosis or clubbing No obvious joint restrictions     SKIN: warm and dry without lesions    NEURO:  alert, approp, nl sensorium with  no motor or cerebellar deficits apparent.          Assessment

## 2018-06-22 NOTE — Patient Instructions (Signed)
Plan A = Automatic = dulera 100 Take 2 puffs first thing in am and then another 2 puffs about 12 hours later.     Work on inhaler technique:  relax and gently blow all the way out then take a nice smooth deep breath back in, triggering the inhaler at same time you start breathing in.  Hold for up to 5 seconds if you can. Blow out thru nose. Rinse and gargle with water when done    Prednisone 10 mg take  4 each am x 2 days,   2 each am x 2 days,  1 each am x 2 days and stop   Plan B = Backup Only use your albuterol as a rescue medication to be used if you can't catch your breath by resting or doing a relaxed purse lip breathing pattern.  - The less you use it, the better it will work when you need it. - Ok to use the inhaler up to 2 puffs  every 4 hours if you must but call for appointment if use goes up over your usual need - Don't leave home without it !!  (think of it like the spare tire for your car)     Please schedule a follow up office visit in  2 weeks, sooner if needed  - be sure to change the appt from next week

## 2018-06-22 NOTE — Assessment & Plan Note (Signed)
FENO 05/04/2018  =   Could not perform Spirometry 05/04/2018  FEV1 1.69 (83%)  Ratio 80 s curvature after am asmanex 200 - Allergy profile 05/04/2018 >  Eos 0.4 /  IgE  43 RAST neg  - 05/04/2018   try change asmanex to dulera 100 2bid    - 06/01/18 trial on singulair / gerd rx and off dulera to sort out uacs vs asthma  - 06/22/2018 flared off dulera 100 > rec pred x 6 days and resume dulera 100 2bid  - 06/22/2018  After extensive coaching inhaler device,  effectiveness =    50% (short Ti)   rx of cough variant asthma can be a challenge in pts who can't use hfa as the dpi's can aggravate the problem and occ necessitate change to neb ics   For now rechallenge with pred x 6 days and rx with dulera 100 bid plus singulair/gerd rx for now  - f/u in 2 weeks with all meds in hand using a trust but verify approach to confirm accurate Medication  Reconciliation The principal here is that until we are certain that the  patients are doing what we've asked, it makes no sense to ask them to do more.    I had an extended discussion with the patient reviewing all relevant studies completed to date and  lasting 15 to 20 minutes of a 25 minute acute office visit    See device teaching which extended face to face time for this visit.  Each maintenance medication was reviewed in detail including emphasizing most importantly the difference between maintenance and prns and under what circumstances the prns are to be triggered using an action plan format that is not reflected in the computer generated alphabetically organized AVS which I have not found useful in most complex patients, especially with respiratory illnesses  Please see AVS for specific instructions unique to this visit that I personally wrote and verbalized to the the pt in detail and then reviewed with pt  by my nurse highlighting any  changes in therapy recommended at today's visit to their plan of care.

## 2018-06-29 ENCOUNTER — Ambulatory Visit: Payer: Medicare Other | Admitting: Internal Medicine

## 2018-07-06 ENCOUNTER — Encounter: Payer: Self-pay | Admitting: Internal Medicine

## 2018-07-06 ENCOUNTER — Ambulatory Visit: Payer: Medicare Other | Admitting: Internal Medicine

## 2018-07-06 VITALS — BP 116/68 | HR 59 | Ht 64.0 in | Wt 139.0 lb

## 2018-07-06 DIAGNOSIS — J45991 Cough variant asthma: Secondary | ICD-10-CM | POA: Diagnosis not present

## 2018-07-06 NOTE — Progress Notes (Signed)
Mariah King, female    DOB: 09-28-1940    MRN: [id number]    Brief patient profile:  76 yowf quit smoking 1960 never regular/ heavy no problems until age of 37 around early 2000s wheezing/sob rx flovent helped some then asmanex/singulair  which helped some but [place] and has avoided it and was given saba for prn use which may have helped some and since April 2019 continues to cough late afternoon and before bed but not noct so referred to pulmonary clinic 05/04/2018 by Dr  Eloise Harman.     History of Present Illness  05/04/2018  Pulmonary consultation/ Mariah King  Chief Complaint  Patient presents with  . Pulmonary Consult    Referred by Dr. Jarome Matin. Pt c/o cough since April 2019. Cough is occ prod with yellow sputum, worse early evening and night but she sleeps okay.   Dyspnea:  Only with exertion  Cough:  no change with abx, worse with anoro, no change with flonase, ? Some better with prednisone (office records say much better, but she doesn't recall it that way) - using lots of breath mints/ mucus usually <  A tsp  Sleep: fine/ two pillows s am flare SABA use: last used 2 m prior to OV   rec Plan A = Automatic = continue montelukast but stop asmanex and start dulera 100 Take 2 puffs first thing in am and then another 2 puffs about 12 hours later.  Work on inhaler technique:  Plan B = Backup Only use your albuterol as a rescue medication GERD diet  If cough not better in a week try adding prilosec otc 20 mg Take 30- 60 min before your first and last meals of the day to the above regimen     06/01/2018  f/u ov/Mariah King re: cough x march 2019 - non adherent with gerd rx  Chief Complaint  Patient presents with  . Follow-up    Cough is about the same. Her breathing is overall doing well. She has the albuterol inhaler but has never used it.    Dyspnea:  Not limited by breathing from desired activities  But notes if does  steep incline>>  sob Cough: sporadic/ dry -  not while asleep -  Notes up to 2-3 days / week worse p stirs in  in am but doesn't disturb sleep Sleeping: flat bed / 2 pillows SABA use: none/ not able to use dulera 100 correctly (< 25% effective)  02: none  rec Prednisone 10 mg take  4 each am x 2 days,   2 each am x 2 days,  1 each am x 2 days and stop  Stop dulera on a trial basis  Protonix 40 mg Take 30- 60 min before your first and last meals of the day  Please remember to go to the  x-ray department downstairs in the basement  for your tests - we will call you with the results when they are available. 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 Acute Visit    Increased cough x 5 days- non prod. She has had some wheezing and chest tightness.     was 100% better p pred x 6 days then gradually worse esp last 5 days  prior to OV  With cough/ subj wheeze/ worse doe but comfortable at rest/ did not try inhalers/ still very iffy on use  rec Plan A = Automatic = dulera 100 Take 2 puffs first thing in am and then another 2 puffs about 12 hours later.  Work on inhaler technique:  relax and gently blow all the way out then take a nice smooth deep breath back in, triggering the inhaler at same time you start breathing in.  Hold for up to 5 seconds if you can. Blow out thru nose. Rinse and gargle with water when done Prednisone 10 mg take  4 each am x 2 days,   2 each am x 2 days,  1 each am x 2 days and stop  Plan B = Backup Only use your albuterol as a rescue medication    07/06/2018  f/u ov/Mariah King re: cough variant asthma Chief Complaint  Patient presents with  . Follow-up    pt states she feels great, no complaints today.    100% improved - no sob or cough and "wants off all these meds"  Sleeping fine / Not limited by  breathing from desired activities     No obvious day to day or daytime variability or assoc excess/ purulent sputum or mucus plugs or hemoptysis or cp or chest tightness, subjective wheeze or overt sinus or hb symptoms.   Sleeping flat/ one pillow  without nocturnal  or early am exacerbation  of respiratory  c/o's or need for noct saba. Also denies any obvious fluctuation of symptoms with weather or environmental changes or other aggravating or alleviating factors except as outlined above   No unusual exposure hx or h/o childhood pna/ asthma or knowledge of premature birth.  Current Allergies, Complete Past Medical History, Past Surgical History, Family History, and Social History were reviewed in [employer] record.  ROS  The following are not active complaints unless bolded Hoarseness, sore throat, dysphagia, dental problems, itching, sneezing,  nasal congestion or discharge of excess mucus or purulent secretions, ear ache,   fever, chills, sweats, unintended wt loss or wt gain, classically pleuritic or exertional cp,  orthopnea pnd or arm/hand swelling  or leg swelling, presyncope, palpitations, abdominal pain, anorexia, nausea, vomiting, diarrhea  or change in bowel habits or change in bladder habits, change in stools or change in urine, dysuria, hematuria,  rash, arthralgias, visual complaints, headache, numbness, weakness or ataxia or problems with walking or coordination,  change in mood or  memory.        Current Meds  Medication Sig  . albuterol (VENTOLIN HFA) 108 (90 Base) MCG/ACT inhaler Inhale 2 puffs into the lungs every 6 (six) hours as needed for wheezing or shortness of breath.  Marland Kitchen FLUoxetine (PROZAC) 40 MG capsule Take 40 mg by mouth every morning.   Marland Kitchen LORazepam (ATIVAN) 0.5 MG tablet Take 0.5 mg by mouth every morning.   . mirtazapine (REMERON) 15 MG tablet Take 15 mg by mouth at bedtime.  . mometasone-formoterol (DULERA) 100-5 MCG/ACT AERO Inhale 2 puffs  into the lungs 2 (two) times daily.  . montelukast (SINGULAIR) 10 MG tablet Take 1 tablet (10 mg total) by mouth every morning.  . pantoprazole (PROTONIX) 40 MG tablet Take 30- 60 min before your first and last meals of the day                  Objective:     amb hoarse wf nad   07/06/2018  139  06/22/2018      138   06/01/18 138 lb 6.4 oz (62.8 kg)  05/04/18 139 lb (63 kg)  02/01/17 143 lb 11.8 oz (65.2 kg)       Vital signs reviewed - Note on arrival 02 sats  94% on RA       HEENT: nl dentition, turbinates bilaterally, and oropharynx. Nl external ear canals without cough reflex   NECK :  without JVD/Nodes/TM/ nl carotid upstrokes bilaterally   LUNGS: no acc muscle use,  Nl contour chest which is clear to A and P bilaterally without cough on insp or exp maneuvers   CV:  RRR  no s3 or murmur or increase in P2, and no edema   ABD:  soft and nontender with nl inspiratory excursion in the supine position. No bruits or organomegaly appreciated, bowel sounds nl  MS:  Nl gait/ ext warm without deformities, calf tenderness, cyanosis or clubbing No obvious joint restrictions   SKIN: warm and dry without lesions    NEURO:  alert, approp, nl sensorium with  no motor or cerebellar deficits apparent.           Assessment

## 2018-07-06 NOTE — Patient Instructions (Addendum)
Stop the pm protonix   A week later, ok to stop pm dulera    At the first sign of a problem resume both of the above and if not better after a week, return to this clinic asap and bring all active medications with you

## 2018-07-07 ENCOUNTER — Encounter: Payer: Self-pay | Admitting: Internal Medicine

## 2018-07-07 NOTE — Assessment & Plan Note (Signed)
FENO 05/04/2018  =   Could not perform Spirometry 05/04/2018  FEV1 1.69 (83%)  Ratio 80 s curvature after am asmanex 200 - Allergy profile 05/04/2018 >  Eos 0.4 /  IgE  43 RAST neg  - 05/04/2018   try change asmanex to dulera 100 2bid    - 06/01/18 trial on singulair / gerd rx and off dulera to sort out uacs vs asthma - 06/22/2018 flared off dulera 100 > rec pred x 6 days and resume dulera 100 2bid  - 06/22/2018  After extensive coaching inhaler device,  effectiveness =    50% (short Ti)  - 07/06/2018 100% improved p prednisone and requesting stop rx    - The proper method of use, as well as anticipated side effects, of a metered-dose inhaler are discussed and demonstrated to the patient. Improved effectiveness after extensive coaching during this visit to a level of approximately 75 % from a baseline of 50 %    She clearly improves on prednisone and should do well on low dose ics - the problem is she tends to stop all the inhalers between visits and using asmanex in past has been particulalry problematic as it takes so long to start working  Reviewed: Based on two studies from NEJM  378; 20 p 1865 (2018) and 380 : p2020-30 (2019) in pts with mild asthma it is reasonable to use low dose symbicort eg 80(and by extrapolation dulera 100)  2bid "prn" flare in this setting but I emphasized this was only shown with symbicort and takes advantage of the rapid onset of action but is not the same as "rescue therapy" but can be stopped once the acute symptoms have resolved and the need for rescue has been minimized (< 2 x weekly)    >>>> Ok to try taper duelra but at the first sign of a flare or need for saba > rec max rx with dulera 100 2bid/ gerd rx and continue singulair as maint for now   If this "action plan" doesn't work she should call for immediate appt, otherwise she wished for pulmonary f/u to be prn     I had an extended discussion with the patient reviewing all relevant studies completed to date and   lasting 15 to 20 minutes of a 25 minute visit    See device teaching which extended face to face time for this visit.  Each maintenance medication was reviewed in detail including emphasizing most importantly the difference between maintenance and prns and under what circumstances the prns are to be triggered using an action plan format that is not reflected in the computer generated alphabetically organized AVS which I have not found useful in most complex patients, especially with respiratory illnesses  Please see AVS for specific instructions unique to this visit that I personally wrote and verbalized to the the pt in detail and then reviewed with pt  by my nurse highlighting any  changes in therapy recommended at today's visit to their plan of care.

## 2018-08-10 DIAGNOSIS — Z961 Presence of intraocular lens: Secondary | ICD-10-CM | POA: Diagnosis not present

## 2018-10-14 DIAGNOSIS — E119 Type 2 diabetes mellitus without complications: Secondary | ICD-10-CM | POA: Diagnosis not present

## 2018-10-14 DIAGNOSIS — M810 Age-related osteoporosis without current pathological fracture: Secondary | ICD-10-CM | POA: Diagnosis not present

## 2018-10-14 DIAGNOSIS — E7849 Other hyperlipidemia: Secondary | ICD-10-CM | POA: Diagnosis not present

## 2018-10-21 DIAGNOSIS — E119 Type 2 diabetes mellitus without complications: Secondary | ICD-10-CM | POA: Diagnosis not present

## 2018-10-21 DIAGNOSIS — Z0000 Encounter for general adult medical examination without abnormal findings: Secondary | ICD-10-CM | POA: Diagnosis not present

## 2018-10-21 DIAGNOSIS — J449 Chronic obstructive pulmonary disease, unspecified: Secondary | ICD-10-CM | POA: Diagnosis not present

## 2018-10-21 DIAGNOSIS — I10 Essential (primary) hypertension: Secondary | ICD-10-CM | POA: Diagnosis not present

## 2018-10-22 DIAGNOSIS — Z1212 Encounter for screening for malignant neoplasm of rectum: Secondary | ICD-10-CM | POA: Diagnosis not present

## 2018-10-26 DIAGNOSIS — M810 Age-related osteoporosis without current pathological fracture: Secondary | ICD-10-CM | POA: Diagnosis not present

## 2019-09-26 ENCOUNTER — Other Ambulatory Visit: Payer: Self-pay

## 2019-09-26 ENCOUNTER — Emergency Department (HOSPITAL_COMMUNITY)
Admission: EM | Admit: 2019-09-26 | Discharge: 2019-09-26 | Disposition: A | Discharge status: Home / Self Care | Payer: Medicare Other | Attending: Emergency Medicine | Admitting: Emergency Medicine

## 2019-09-26 ENCOUNTER — Emergency Department (HOSPITAL_COMMUNITY): Payer: Medicare Other

## 2019-09-26 ENCOUNTER — Encounter (HOSPITAL_COMMUNITY): Payer: Self-pay

## 2019-09-26 DIAGNOSIS — S81012A Laceration without foreign body, left knee, initial encounter: Secondary | ICD-10-CM | POA: Diagnosis not present

## 2019-09-26 DIAGNOSIS — S0181XA Laceration without foreign body of other part of head, initial encounter: Secondary | ICD-10-CM | POA: Diagnosis not present

## 2019-09-26 DIAGNOSIS — S81011A Laceration without foreign body, right knee, initial encounter: Secondary | ICD-10-CM | POA: Insufficient documentation

## 2019-09-26 DIAGNOSIS — W010XXA Fall on same level from slipping, tripping and stumbling without subsequent striking against object, initial encounter: Secondary | ICD-10-CM | POA: Insufficient documentation

## 2019-09-26 DIAGNOSIS — Y92007 Garden or yard of unspecified non-institutional (private) residence as the place of occurrence of the external cause: Secondary | ICD-10-CM | POA: Diagnosis not present

## 2019-09-26 DIAGNOSIS — Y999 Unspecified external cause status: Secondary | ICD-10-CM | POA: Diagnosis not present

## 2019-09-26 DIAGNOSIS — W19XXXA Unspecified fall, initial encounter: Secondary | ICD-10-CM

## 2019-09-26 DIAGNOSIS — Z23 Encounter for immunization: Secondary | ICD-10-CM | POA: Insufficient documentation

## 2019-09-26 DIAGNOSIS — J45909 Unspecified asthma, uncomplicated: Secondary | ICD-10-CM | POA: Diagnosis not present

## 2019-09-26 DIAGNOSIS — S199XXA Unspecified injury of neck, initial encounter: Secondary | ICD-10-CM | POA: Diagnosis not present

## 2019-09-26 DIAGNOSIS — S0101XA Laceration without foreign body of scalp, initial encounter: Secondary | ICD-10-CM | POA: Diagnosis not present

## 2019-09-26 DIAGNOSIS — Z87891 Personal history of nicotine dependence: Secondary | ICD-10-CM | POA: Insufficient documentation

## 2019-09-26 DIAGNOSIS — S0990XA Unspecified injury of head, initial encounter: Secondary | ICD-10-CM | POA: Diagnosis not present

## 2019-09-26 DIAGNOSIS — Y9301 Activity, walking, marching and hiking: Secondary | ICD-10-CM | POA: Diagnosis not present

## 2019-09-26 DIAGNOSIS — Z79899 Other long term (current) drug therapy: Secondary | ICD-10-CM | POA: Diagnosis not present

## 2019-09-26 MED ORDER — TETANUS-DIPHTH-ACELL PERTUSSIS 5-2.5-18.5 LF-MCG/0.5 IM SUSP
0.5000 mL | Freq: Once | INTRAMUSCULAR | Status: AC
  Administered 2019-09-26: 0.5 mL via INTRAMUSCULAR
  Filled 2019-09-26: qty 0.5

## 2019-09-26 MED ORDER — LIDOCAINE-EPINEPHRINE 2 %-1:100000 IJ SOLN
20.0000 mL | Freq: Once | INTRAMUSCULAR | Status: AC
  Administered 2019-09-26: 20 mL via INTRADERMAL
  Filled 2019-09-26: qty 1

## 2019-09-26 NOTE — ED Provider Notes (Signed)
[facility] DEPT Provider Note   CSN: [id number] Arrival date & time: 09/26/19  3235     History Chief Complaint  Patient presents with  . Fall    Mariah King is a 79 y.o. female presents emergency with a chief complaint of mechanical fall patient states that she was going out to get her newspaper earlier this morning when she tripped, fell onto both her knees and then hit her head against the concrete.  She did not lose consciousness.  She was able to get back up and walk into the house.  She has a large laceration to the right side of her forehead and to the bilateral knees.  She does not take any blood thinners.  Last tetanus vaccination was in 2013.  She denies any injuries to the hands.  HPI     Past Medical History:  Diagnosis Date  . Asthma   . Depression     Patient Active Problem List   Diagnosis Date Noted  . Cough variant asthma vs uacs 05/04/2018  . AKI (acute kidney injury) (Uncertain) 02/01/2017  . Sepsis ([place]) 02/01/2017  . Nausea vomiting and diarrhea 02/01/2017  . Abdominal pain 02/01/2017  . Asthma   . Depression     Past Surgical History:  Procedure Laterality Date  . TONSILLECTOMY    . TUBAL LIGATION       OB History   No obstetric history on file.     Family History  Problem Relation Age of Onset  . Lung cancer Mother        never smoked  . Celiac disease Sister     Social History   Tobacco Use  . Smoking status: Former Smoker    Packs/day: 0.25    Years: 5.00    Pack years: 1.25    Types: Cigarettes    Quit date: 09/01/1958    Years since quitting: 61.1  . Smokeless tobacco: Never Used  Substance Use Topics  . Alcohol use: No  . Drug use: No    Home Medications Prior to Admission medications   Medication Sig Start Date End Date Taking? Authorizing Provider  acetaminophen (TYLENOL) 325 MG tablet Take 650 mg by mouth every 6 (six) hours as needed for mild pain or headache.   Yes [provider]  ADVAIR DISKUS 250-50 MCG/DOSE AEPB Inhale 1 puff into the lungs daily. 09/06/19  Yes [provider]  FLUoxetine (PROZAC) 40 MG capsule Take 40 mg by mouth every morning.    Yes [provider]  LORazepam (ATIVAN) 0.5 MG tablet Take 0.5 mg by mouth every morning.    Yes [provider]  mirtazapine (REMERON) 15 MG tablet Take 15 mg by mouth at bedtime.   Yes [provider]  Vitamin D, Ergocalciferol, (DRISDOL) 1.25 MG (50000 UNIT) CAPS capsule Take 50,000 Units by mouth once a week. 07/06/19  Yes [provider]  mometasone-formoterol (DULERA) 100-5 MCG/ACT AERO Inhale 2 puffs into the lungs 2 (two) times daily. Patient not taking: Reported on 09/26/2019 06/22/18   Tanda Rockers, MD  montelukast (SINGULAIR) 10 MG tablet Take 1 tablet (10 mg total) by mouth every morning. Patient not taking: Reported on 09/26/2019 06/22/18   Tanda Rockers, MD  pantoprazole (PROTONIX) 40 MG tablet Take 30- 60 min before your first and last meals of the day Patient not taking: Reported on 09/26/2019 06/01/18   Tanda Rockers, MD    Allergies    Sulfa  antibiotics  Review of Systems   Review of Systems Ten systems reviewed and are negative for acute change, except as noted in the HPI.   Physical Exam Updated Vital Signs BP (!) 128/109   Pulse 82   Temp 97.8 F (36.6 C)   Resp 16   SpO2 94%   Physical Exam Physical Exam  Nursing note and vitals reviewed. Constitutional: She is oriented to person, place, and time. She appears well-developed and well-nourished. No distress.  HENT:  Head: Normocephalic.  Large right-sided forehead laceration.  Elliptical in shape.  Able to raise the eyebrow and squeeze the eyes shut.  Eyes: Conjunctivae normal and EOM are normal. Pupils are equal, round, and reactive to light. No scleral icterus.  Neck: Normal range of motion.  Cardiovascular: Normal rate, regular rhythm and normal heart sounds.  Exam reveals no gallop and  no friction rub.   No murmur heard. Pulmonary/Chest: Effort normal and breath sounds normal. No respiratory distress.  Abdominal: Soft. Bowel sounds are normal. She exhibits no distension and no mass. There is no tenderness. There is no guarding.  Musculoskeletal: Bilateral knees with lacerations over the patella.  Patella is nontender.  Able to easily flex and extend with full range of motion both knees. Neurological: She is alert and oriented to person, place, and time.Speech is clear and goal oriented, follows commands Major Cranial nerves without deficit, no facial droop Normal strength in upper and lower extremities bilaterally including dorsiflexion and plantar flexion, strong and equal grip strength Sensation normal to light and sharp touch Moves extremities without ataxia, coordination intact Normal finger to nose and rapid alternating movements Neg romberg, no pronator drift Normal gait Normal heel-shin and balance  Skin: Skin is warm and dry. She is not diaphoretic. She has multiple wounds.   ED Results / Procedures / Treatments   Labs (all labs ordered are listed, but only abnormal results are displayed) Labs Reviewed - No data to display  EKG None  Radiology No results found.  Procedures .Marland KitchenLaceration Repair  Date/Time: 09/26/2019 11:43 AM Performed by: Arthor Captain, PA-C Authorized by: Arthor Captain, PA-C   Consent:    Consent obtained:  Verbal   Consent given by:  Patient   Risks discussed:  Infection, poor cosmetic result and poor wound healing Anesthesia (see MAR for exact dosages):    Anesthesia method:  Local infiltration   Local anesthetic:  Lidocaine 2% WITH epi Laceration details:    Location:  Face   Face location:  Forehead   Length (cm):  8   Depth (mm):  10 (Open flap avulsion) Repair type:    Repair type:  Intermediate Pre-procedure details:    Preparation:  Patient was prepped and draped in usual sterile fashion Exploration:    Wound  exploration: wound explored through full range of motion and entire depth of wound probed and visualized   Treatment:    Area cleansed with:  Betadine   Amount of cleaning:  Standard   Irrigation solution:  Sterile water   Irrigation method:  Pressure wash Skin repair:    Repair method:  Sutures and tissue adhesive   Suture size:  5-0   Wound skin closure material used: Vicryl Rapide.   Suture technique:  Subcuticular and running   Number of sutures:  10 Post-procedure details:    Dressing:  Adhesive bandage   Patient tolerance of procedure:  Tolerated well, no immediate complications .Marland KitchenLaceration Repair  Date/Time: 09/26/2019 11:46 AM Performed by: Arthor Captain, PA-C Authorized  by: Arthor Captain, PA-C   Consent:    Consent obtained:  Verbal   Consent given by:  Patient   Risks discussed:  Infection, need for additional repair, pain, poor cosmetic result and poor wound healing   Alternatives discussed:  No treatment and delayed treatment Universal protocol:    Procedure explained and questions answered to patient or proxy's satisfaction: yes     Relevant documents present and verified: yes     Test results available and properly labeled: yes     Imaging studies available: yes     Required blood products, implants, devices, and special equipment available: yes     Site/side marked: yes     Immediately prior to procedure, a time out was called: yes     Patient identity confirmed:  Verbally with patient Anesthesia (see MAR for exact dosages):    Anesthesia method:  Local infiltration   Local anesthetic:  Lidocaine 2% WITH epi Laceration details:    Location:  Leg   Leg location:  R knee   Length (cm):  4 Repair type:    Repair type:  Simple Pre-procedure details:    Preparation:  Patient was prepped and draped in usual sterile fashion Exploration:    Wound exploration: wound explored through full range of motion and entire depth of wound probed and visualized     Treatment:    Area cleansed with:  Betadine   Amount of cleaning:  Standard   Irrigation method:  Pressure wash Skin repair:    Repair method:  Sutures   Suture size:  4-0   Suture material:  Prolene   Suture technique:  Simple interrupted   Number of sutures:  4 Approximation:    Approximation:  Close Post-procedure details:    Dressing:  Non-adherent dressing   Patient tolerance of procedure:  Tolerated well, no immediate complications .Marland KitchenLaceration Repair  Date/Time: 09/26/2019 11:47 AM Performed by: Arthor Captain, PA-C Authorized by: Arthor Captain, PA-C   Consent:    Consent obtained:  Verbal   Consent given by:  Patient   Risks discussed:  Infection, need for additional repair, pain, poor cosmetic result and poor wound healing   Alternatives discussed:  No treatment and delayed treatment Universal protocol:    Procedure explained and questions answered to patient or proxy's satisfaction: yes     Relevant documents present and verified: yes     Test results available and properly labeled: yes     Imaging studies available: yes     Required blood products, implants, devices, and special equipment available: yes     Site/side marked: yes     Immediately prior to procedure, a time out was called: yes     Patient identity confirmed:  Verbally with patient Anesthesia (see MAR for exact dosages):    Anesthesia method:  Local infiltration   Local anesthetic:  Lidocaine 2% WITH epi Laceration details:    Location:  Leg   Leg location:  L knee   Length (cm):  3 Repair type:    Repair type:  Simple Pre-procedure details:    Preparation:  Patient was prepped and draped in usual sterile fashion Exploration:    Wound exploration: wound explored through full range of motion and entire depth of wound probed and visualized   Treatment:    Area cleansed with:  Betadine   Amount of cleaning:  Standard   Irrigation solution:  Sterile saline   Irrigation method:  Pressure  wash Skin repair:  Repair method:  Sutures   Suture size:  4-0   Suture material:  Prolene   Suture technique:  Simple interrupted   Number of sutures:  3 Approximation:    Approximation:  Close Post-procedure details:    Dressing:  Non-adherent dressing   Patient tolerance of procedure:  Tolerated well, no immediate complications   (including critical care time)  Medications Ordered in ED Medications  Tdap (BOOSTRIX) injection 0.5 mL (has no administration in time range)    ED Course  I have reviewed the triage vital signs and the nursing notes.  Pertinent labs & imaging results that were available during my care of the patient were reviewed by me and considered in my medical decision making (see chart for details).    MDM Rules/Calculators/A&P                       Final Clinical Impression(s) / ED Diagnoses Final diagnoses:  None  79 year old female here with mechanical fall.  Complicated laceration of the forehead and simple lacerations of the bilateral knees.  I consulted with Dr. Drake Leach him on call for maxillofacial trauma who asked me to go ahead and repair the laceration despite potential weakness of the musculature and have her follow closely with her in the clinic.  I have personally reviewed the patient's CT head and C-spine which showed no apparent fractures or other abnormalities on my interpretation.  Patient tolerated all the procedures well.  She is ambulatory.  Encouraged cryotherapy and Tylenol.  Discussed return precautions.  She appears appropriate for discharge at this time  Rx / DC Orders ED Discharge Orders    None       Arthor Captain, PA-C 09/26/19 1656    Glynn Octave, MD 09/26/19 [id number]

## 2019-09-26 NOTE — Discharge Instructions (Addendum)
WOUND CARE You do not need suture removal form your forehead. Please have your stitches/staples removed from your knees in 10 days or sooner if you have concerns. You may do this at any available urgent care or at your primary care doctor's office.  Keep area clean and dry for 24 hours. Do not remove bandage, if applied.  After 24 hours, remove bandage and wash wound gently with mild soap and warm water. Reapply a new bandage after cleaning wound, if directed.  Continue daily cleansing with soap and water until stitches/staples are removed.  Do not apply any ointments or creams to the wound while stitches/staples are in place, as this may cause delayed healing.  Seek medical careif you experience any of the following signs of infection: Swelling, redness, pus drainage, streaking, fever >101.0 F  Seek care if you experience excessive bleeding that does not stop after 15-20 minutes of constant, firm pressure.

## 2019-09-26 NOTE — ED Triage Notes (Signed)
Pt fell today trying to get the newspaper. Pt has large lac on right side of forehead, and bilateral 3cm lacs on knees. Pt denies LOC. Not on blood thinners. Last tetanus 2013.

## 2019-09-28 DIAGNOSIS — S0191XA Laceration without foreign body of unspecified part of head, initial encounter: Secondary | ICD-10-CM | POA: Diagnosis not present

## 2019-09-28 DIAGNOSIS — S81011A Laceration without foreign body, right knee, initial encounter: Secondary | ICD-10-CM | POA: Diagnosis not present

## 2019-09-28 DIAGNOSIS — S81012A Laceration without foreign body, left knee, initial encounter: Secondary | ICD-10-CM | POA: Diagnosis not present

## 2019-10-05 DIAGNOSIS — S81011S Laceration without foreign body, right knee, sequela: Secondary | ICD-10-CM | POA: Diagnosis not present

## 2019-10-05 DIAGNOSIS — S0191XS Laceration without foreign body of unspecified part of head, sequela: Secondary | ICD-10-CM | POA: Diagnosis not present

## 2019-10-05 DIAGNOSIS — S81012S Laceration without foreign body, left knee, sequela: Secondary | ICD-10-CM | POA: Diagnosis not present

## 2020-05-18 DIAGNOSIS — H524 Presbyopia: Secondary | ICD-10-CM | POA: Diagnosis not present

## 2020-09-06 DIAGNOSIS — E785 Hyperlipidemia, unspecified: Secondary | ICD-10-CM | POA: Diagnosis not present

## 2020-09-06 DIAGNOSIS — R7301 Impaired fasting glucose: Secondary | ICD-10-CM | POA: Diagnosis not present

## 2020-09-06 DIAGNOSIS — M810 Age-related osteoporosis without current pathological fracture: Secondary | ICD-10-CM | POA: Diagnosis not present

## 2020-09-13 DIAGNOSIS — Z1212 Encounter for screening for malignant neoplasm of rectum: Secondary | ICD-10-CM | POA: Diagnosis not present

## 2020-09-13 DIAGNOSIS — G250 Essential tremor: Secondary | ICD-10-CM | POA: Diagnosis not present

## 2020-09-13 DIAGNOSIS — R82998 Other abnormal findings in urine: Secondary | ICD-10-CM | POA: Diagnosis not present

## 2020-09-13 DIAGNOSIS — E119 Type 2 diabetes mellitus without complications: Secondary | ICD-10-CM | POA: Diagnosis not present

## 2020-09-13 DIAGNOSIS — I10 Essential (primary) hypertension: Secondary | ICD-10-CM | POA: Diagnosis not present

## 2020-09-13 DIAGNOSIS — E785 Hyperlipidemia, unspecified: Secondary | ICD-10-CM | POA: Diagnosis not present

## 2020-09-13 DIAGNOSIS — Z0000 Encounter for general adult medical examination without abnormal findings: Secondary | ICD-10-CM | POA: Diagnosis not present

## 2020-10-09 DIAGNOSIS — Z23 Encounter for immunization: Secondary | ICD-10-CM | POA: Diagnosis not present

## 2020-11-05 IMAGING — CT CT CERVICAL SPINE W/O CM
3 of 4 series · 13 of 33 positions shown, 16 images · non-contrast
Comparison: No priors.

CLINICAL DATA: 78-year-old female with history of fall presenting
with facial trauma. Large laceration on the right side of the
forehead.

EXAM:
CT HEAD WITHOUT CONTRAST
CT CERVICAL SPINE WITHOUT CONTRAST
TECHNIQUE: Multidetector CT imaging of the head and cervical spine was
performed following the standard protocol without intravenous
contrast. Multiplanar CT image reconstructions of the cervical spine
were also generated.

[Series 4: orthogonal bone · axial · 0.23mm/px · z∈[-269,-168]mm · 5 of 78 slices shown, 7 images]
[im 13/78  soft-tissue]
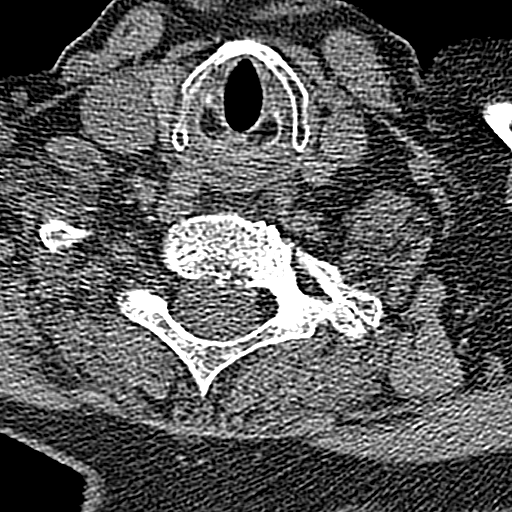
[im 13/78  bone]
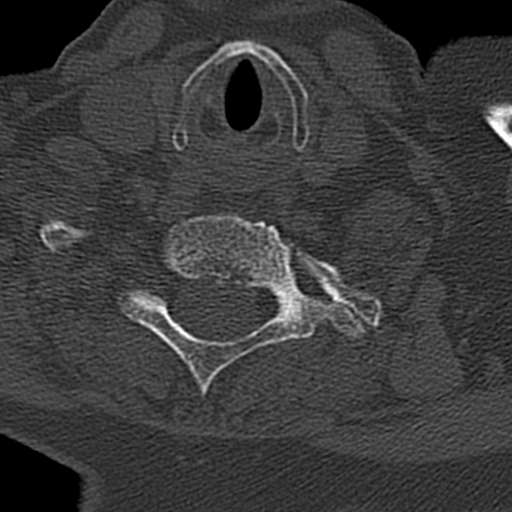
[im 26/78  bone]
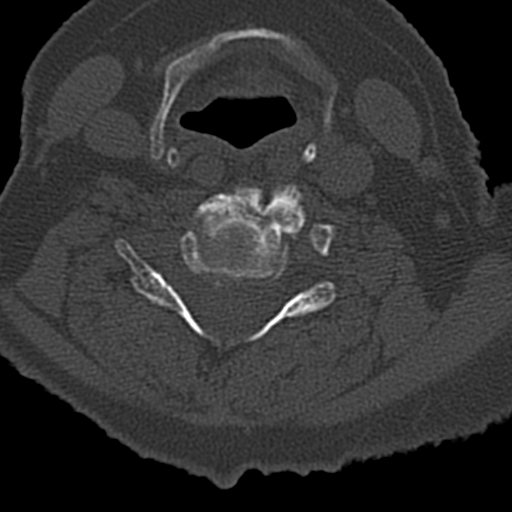
[im 39/78  bone]
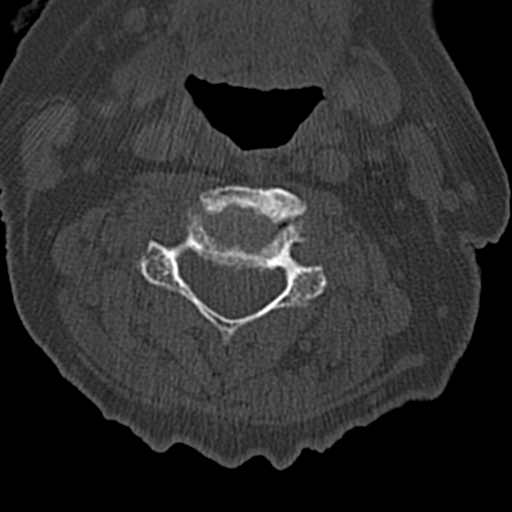
[im 52/78  bone]
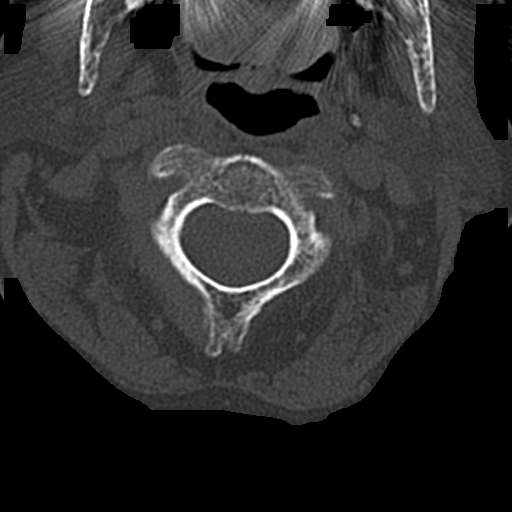
[im 65/78  soft-tissue]
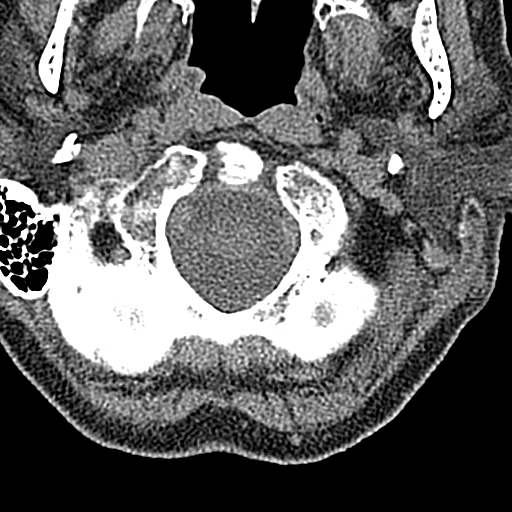
[im 65/78  bone]
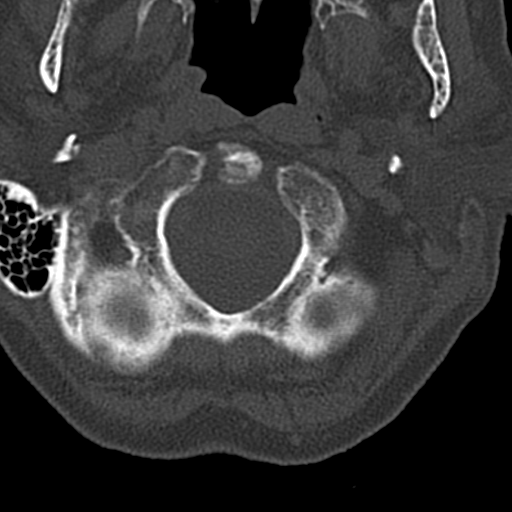

[Series 5: coronal bone · coronal · 0.27mm/px · 3 of 61 slices shown]
[im 13/61  bone]
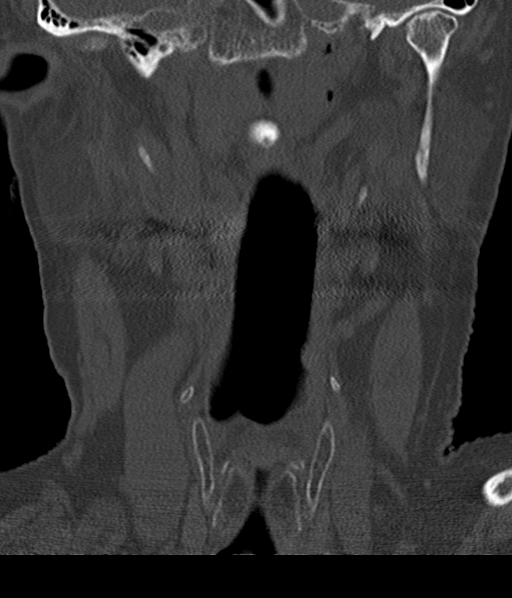
[im 25/61  bone]
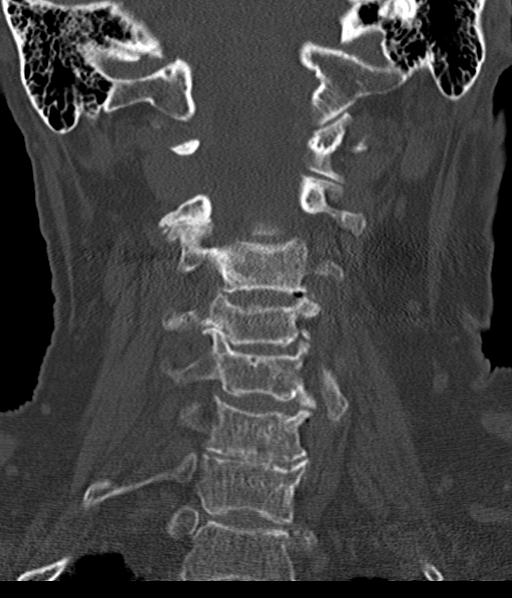
[im 37/61  bone]
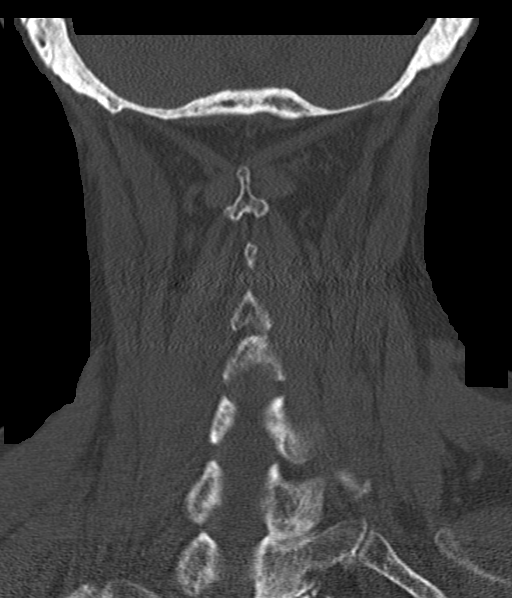

[Series 6: sagittal bone · sagittal · 0.25mm/px · 5 of 61 slices shown, 6 images]
[im 21/61  bone]
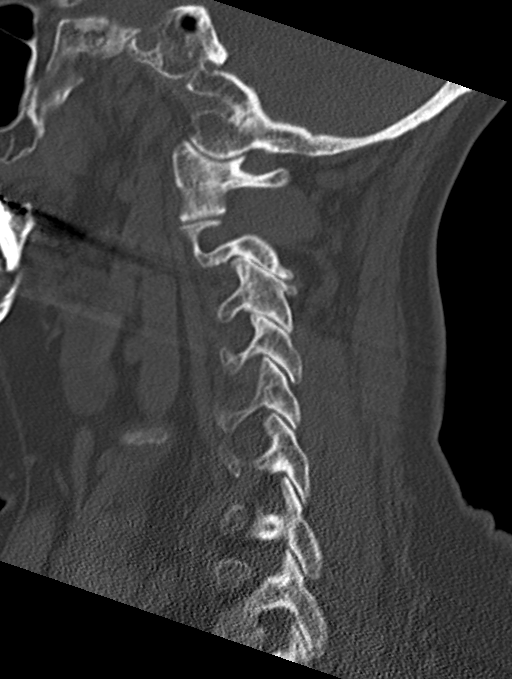
[im 26/61  bone]
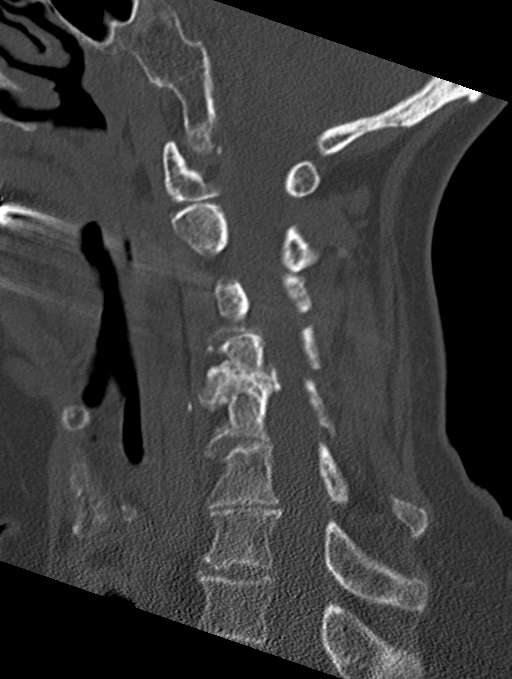
[im 31/61  soft-tissue]
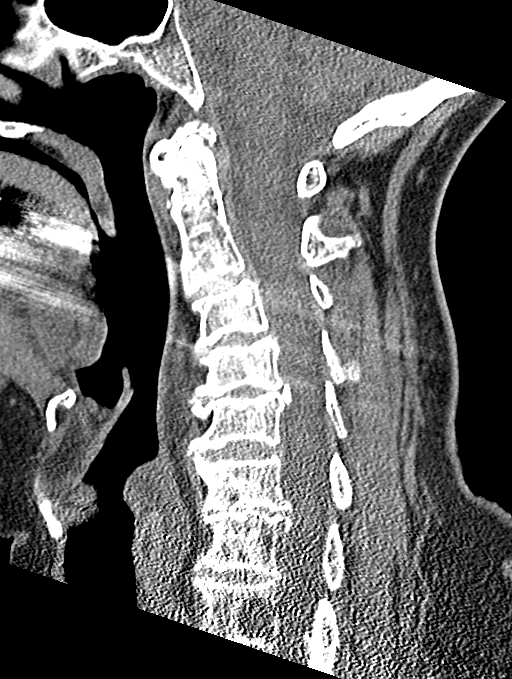
[im 31/61  bone]
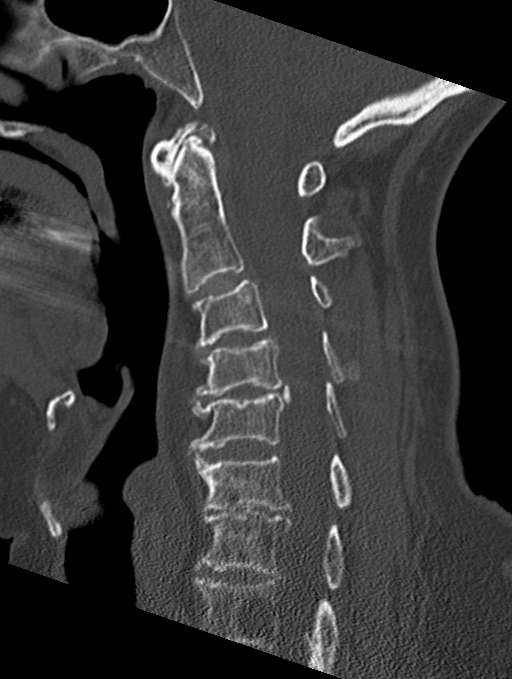
[im 36/61  bone]
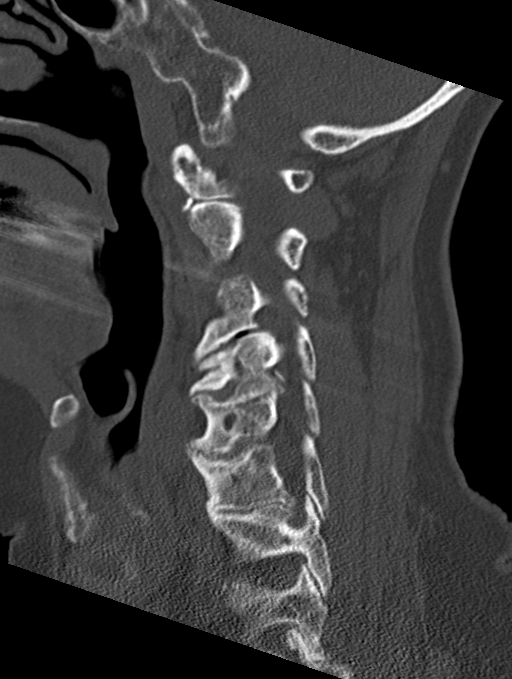
[im 41/61  bone]
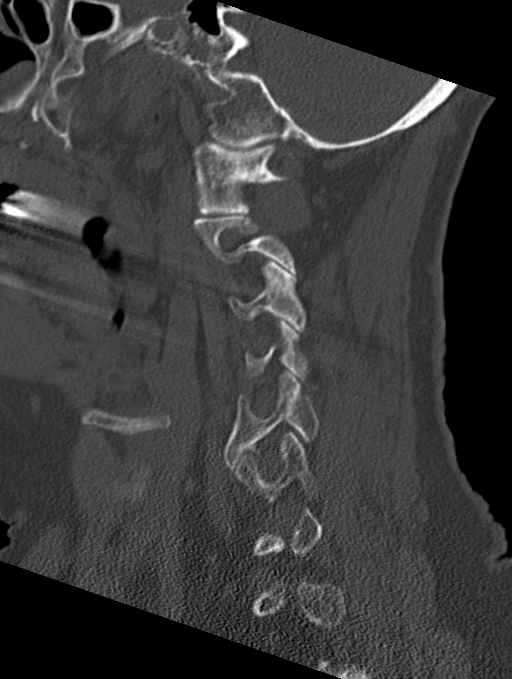

[13 of 33 positions shown; findings below may reference images not displayed]

FINDINGS: CT HEAD FINDINGS

Brain: No evidence of acute infarction, hemorrhage, hydrocephalus,
extra-axial collection or mass lesion/mass effect.

Vascular: No hyperdense vessel or unexpected calcification.

Skull: Normal. Negative for fracture or focal lesion.

Sinuses/Orbits: No acute finding. Mild mucosal thickening in the
left maxillary sinus incidentally noted.

Other: Soft tissue irregularity in the right frontal scalp,
corresponding to reported laceration.

CT CERVICAL SPINE FINDINGS

Alignment: Reversal of normal cervical lordosis centered at the
level of C4, presumably positional.

Skull base and vertebrae: No acute fracture. No primary bone lesion
or focal pathologic process. Lucent lesion with well-defined margins
and internal vertical trabeculations in the left side of the
posterior T1 vertebral body, compatible with a cavernous hemangioma.

Soft tissues and spinal canal: No prevertebral fluid or swelling. No
visible canal hematoma.

Disc levels: Multilevel degenerative disc disease, most severe at
C4-C5, C5-C6 and C6-C7. Moderate multilevel facet arthropathy.

Upper chest: Unremarkable.

Other: None.
IMPRESSION: 1. Right frontal scalp laceration. No underlying displaced skull
fracture, signs of significant acute intracranial trauma or evidence
of significant acute traumatic injury to the cervical spine.
2. Normal appearance of the brain.
3. Multilevel degenerative disc disease and cervical spondylosis, as
above.

## 2021-10-02 DIAGNOSIS — E119 Type 2 diabetes mellitus without complications: Secondary | ICD-10-CM | POA: Diagnosis not present

## 2021-10-02 DIAGNOSIS — E785 Hyperlipidemia, unspecified: Secondary | ICD-10-CM | POA: Diagnosis not present

## 2021-10-02 DIAGNOSIS — I10 Essential (primary) hypertension: Secondary | ICD-10-CM | POA: Diagnosis not present

## 2021-10-02 DIAGNOSIS — M810 Age-related osteoporosis without current pathological fracture: Secondary | ICD-10-CM | POA: Diagnosis not present

## 2021-10-10 DIAGNOSIS — R82998 Other abnormal findings in urine: Secondary | ICD-10-CM | POA: Diagnosis not present

## 2021-10-10 DIAGNOSIS — I10 Essential (primary) hypertension: Secondary | ICD-10-CM | POA: Diagnosis not present

## 2021-12-31 DIAGNOSIS — M79651 Pain in right thigh: Secondary | ICD-10-CM | POA: Diagnosis not present

## 2021-12-31 DIAGNOSIS — M7071 Other bursitis of hip, right hip: Secondary | ICD-10-CM | POA: Diagnosis not present

## 2022-04-10 DIAGNOSIS — D72829 Elevated white blood cell count, unspecified: Secondary | ICD-10-CM | POA: Diagnosis not present

## 2022-04-10 DIAGNOSIS — E785 Hyperlipidemia, unspecified: Secondary | ICD-10-CM | POA: Diagnosis not present

## 2022-04-10 DIAGNOSIS — I10 Essential (primary) hypertension: Secondary | ICD-10-CM | POA: Diagnosis not present

## 2022-04-10 DIAGNOSIS — E119 Type 2 diabetes mellitus without complications: Secondary | ICD-10-CM | POA: Diagnosis not present

## 2022-11-20 DIAGNOSIS — R7989 Other specified abnormal findings of blood chemistry: Secondary | ICD-10-CM | POA: Diagnosis not present

## 2022-11-20 DIAGNOSIS — I10 Essential (primary) hypertension: Secondary | ICD-10-CM | POA: Diagnosis not present

## 2022-11-20 DIAGNOSIS — E785 Hyperlipidemia, unspecified: Secondary | ICD-10-CM | POA: Diagnosis not present

## 2022-11-20 DIAGNOSIS — E119 Type 2 diabetes mellitus without complications: Secondary | ICD-10-CM | POA: Diagnosis not present

## 2022-11-20 DIAGNOSIS — K219 Gastro-esophageal reflux disease without esophagitis: Secondary | ICD-10-CM | POA: Diagnosis not present

## 2022-11-20 DIAGNOSIS — M810 Age-related osteoporosis without current pathological fracture: Secondary | ICD-10-CM | POA: Diagnosis not present

## 2022-11-27 DIAGNOSIS — Z1331 Encounter for screening for depression: Secondary | ICD-10-CM | POA: Diagnosis not present

## 2022-11-27 DIAGNOSIS — I10 Essential (primary) hypertension: Secondary | ICD-10-CM | POA: Diagnosis not present

## 2022-11-27 DIAGNOSIS — Z0000 Encounter for general adult medical examination without abnormal findings: Secondary | ICD-10-CM | POA: Diagnosis not present

## 2022-11-27 DIAGNOSIS — E119 Type 2 diabetes mellitus without complications: Secondary | ICD-10-CM | POA: Diagnosis not present

## 2023-02-12 DIAGNOSIS — F3342 Major depressive disorder, recurrent, in full remission: Secondary | ICD-10-CM | POA: Diagnosis not present

## 2023-05-28 DIAGNOSIS — J449 Chronic obstructive pulmonary disease, unspecified: Secondary | ICD-10-CM | POA: Diagnosis not present

## 2023-05-28 DIAGNOSIS — I10 Essential (primary) hypertension: Secondary | ICD-10-CM | POA: Diagnosis not present

## 2023-05-28 DIAGNOSIS — E119 Type 2 diabetes mellitus without complications: Secondary | ICD-10-CM | POA: Diagnosis not present

## 2023-05-29 DIAGNOSIS — E119 Type 2 diabetes mellitus without complications: Secondary | ICD-10-CM | POA: Diagnosis not present

## 2023-05-29 DIAGNOSIS — Z961 Presence of intraocular lens: Secondary | ICD-10-CM | POA: Diagnosis not present

## 2023-08-03 DIAGNOSIS — M109 Gout, unspecified: Secondary | ICD-10-CM | POA: Diagnosis not present

## 2023-08-03 DIAGNOSIS — M791 Myalgia, unspecified site: Secondary | ICD-10-CM | POA: Diagnosis not present

## 2023-09-15 DIAGNOSIS — R350 Frequency of micturition: Secondary | ICD-10-CM | POA: Diagnosis not present

## 2023-09-15 DIAGNOSIS — N390 Urinary tract infection, site not specified: Secondary | ICD-10-CM | POA: Diagnosis not present

## 2023-09-15 DIAGNOSIS — N8110 Cystocele, unspecified: Secondary | ICD-10-CM | POA: Diagnosis not present

## 2023-09-22 ENCOUNTER — Emergency Department (HOSPITAL_COMMUNITY): Payer: Medicare Other

## 2023-09-22 ENCOUNTER — Emergency Department (HOSPITAL_COMMUNITY)
Admission: EM | Admit: 2023-09-22 | Discharge: 2023-09-22 | Disposition: A | Discharge status: Home / Self Care | Payer: Medicare Other | Source: Home / Self Care

## 2023-09-22 ENCOUNTER — Encounter (HOSPITAL_COMMUNITY): Payer: Self-pay | Admitting: *Deleted

## 2023-09-22 ENCOUNTER — Other Ambulatory Visit: Payer: Self-pay

## 2023-09-22 DIAGNOSIS — D75839 Thrombocytosis, unspecified: Secondary | ICD-10-CM | POA: Diagnosis not present

## 2023-09-22 DIAGNOSIS — R1030 Lower abdominal pain, unspecified: Secondary | ICD-10-CM | POA: Diagnosis not present

## 2023-09-22 DIAGNOSIS — D219 Benign neoplasm of connective and other soft tissue, unspecified: Secondary | ICD-10-CM

## 2023-09-22 DIAGNOSIS — Z7951 Long term (current) use of inhaled steroids: Secondary | ICD-10-CM | POA: Diagnosis not present

## 2023-09-22 DIAGNOSIS — J45909 Unspecified asthma, uncomplicated: Secondary | ICD-10-CM | POA: Diagnosis not present

## 2023-09-22 DIAGNOSIS — D72829 Elevated white blood cell count, unspecified: Secondary | ICD-10-CM | POA: Diagnosis not present

## 2023-09-22 DIAGNOSIS — I700 Atherosclerosis of aorta: Secondary | ICD-10-CM | POA: Diagnosis not present

## 2023-09-22 DIAGNOSIS — D259 Leiomyoma of uterus, unspecified: Secondary | ICD-10-CM | POA: Diagnosis not present

## 2023-09-22 DIAGNOSIS — R8290 Unspecified abnormal findings in urine: Secondary | ICD-10-CM | POA: Diagnosis not present

## 2023-09-22 DIAGNOSIS — F32A Depression, unspecified: Secondary | ICD-10-CM | POA: Diagnosis not present

## 2023-09-22 DIAGNOSIS — Z79899 Other long term (current) drug therapy: Secondary | ICD-10-CM | POA: Diagnosis not present

## 2023-09-22 DIAGNOSIS — E872 Acidosis, unspecified: Secondary | ICD-10-CM | POA: Diagnosis not present

## 2023-09-22 DIAGNOSIS — N179 Acute kidney failure, unspecified: Secondary | ICD-10-CM | POA: Diagnosis not present

## 2023-09-22 DIAGNOSIS — R7309 Other abnormal glucose: Secondary | ICD-10-CM | POA: Insufficient documentation

## 2023-09-22 DIAGNOSIS — K559 Vascular disorder of intestine, unspecified: Secondary | ICD-10-CM | POA: Diagnosis not present

## 2023-09-22 DIAGNOSIS — Z882 Allergy status to sulfonamides status: Secondary | ICD-10-CM | POA: Diagnosis not present

## 2023-09-22 DIAGNOSIS — E8721 Acute metabolic acidosis: Secondary | ICD-10-CM | POA: Diagnosis not present

## 2023-09-22 DIAGNOSIS — G4700 Insomnia, unspecified: Secondary | ICD-10-CM | POA: Diagnosis not present

## 2023-09-22 DIAGNOSIS — R102 Pelvic and perineal pain: Secondary | ICD-10-CM | POA: Diagnosis not present

## 2023-09-22 DIAGNOSIS — E119 Type 2 diabetes mellitus without complications: Secondary | ICD-10-CM | POA: Diagnosis not present

## 2023-09-22 DIAGNOSIS — N390 Urinary tract infection, site not specified: Secondary | ICD-10-CM | POA: Diagnosis not present

## 2023-09-22 DIAGNOSIS — N888 Other specified noninflammatory disorders of cervix uteri: Secondary | ICD-10-CM | POA: Diagnosis not present

## 2023-09-22 DIAGNOSIS — K760 Fatty (change of) liver, not elsewhere classified: Secondary | ICD-10-CM | POA: Diagnosis not present

## 2023-09-22 DIAGNOSIS — E871 Hypo-osmolality and hyponatremia: Secondary | ICD-10-CM | POA: Diagnosis not present

## 2023-09-22 DIAGNOSIS — Z87891 Personal history of nicotine dependence: Secondary | ICD-10-CM | POA: Diagnosis not present

## 2023-09-22 DIAGNOSIS — Z7984 Long term (current) use of oral hypoglycemic drugs: Secondary | ICD-10-CM | POA: Diagnosis not present

## 2023-09-22 DIAGNOSIS — R109 Unspecified abdominal pain: Secondary | ICD-10-CM | POA: Diagnosis not present

## 2023-09-22 LAB — CBC WITH DIFFERENTIAL/PLATELET
Abs Immature Granulocytes: 0.09 10*3/uL — ABNORMAL HIGH (ref 0.00–0.07)
Basophils Absolute: 0.1 10*3/uL (ref 0.0–0.1)
Basophils Relative: 1 %
Eosinophils Absolute: 0.1 10*3/uL (ref 0.0–0.5)
Eosinophils Relative: 1 %
HCT: 43.5 % (ref 36.0–46.0)
Hemoglobin: 14.4 g/dL (ref 12.0–15.0)
Immature Granulocytes: 1 %
Lymphocytes Relative: 35 %
Lymphs Abs: 4.8 10*3/uL — ABNORMAL HIGH (ref 0.7–4.0)
MCH: 28.7 pg (ref 26.0–34.0)
MCHC: 33.1 g/dL (ref 30.0–36.0)
MCV: 86.7 fL (ref 80.0–100.0)
Monocytes Absolute: 0.9 10*3/uL (ref 0.1–1.0)
Monocytes Relative: 6 %
Neutro Abs: 7.9 10*3/uL — ABNORMAL HIGH (ref 1.7–7.7)
Neutrophils Relative %: 56 %
Platelets: 373 10*3/uL (ref 150–400)
RBC: 5.02 MIL/uL (ref 3.87–5.11)
RDW: 14.3 % (ref 11.5–15.5)
WBC: 13.9 10*3/uL — ABNORMAL HIGH (ref 4.0–10.5)
nRBC: 0 % (ref 0.0–0.2)

## 2023-09-22 LAB — URINALYSIS, ROUTINE W REFLEX MICROSCOPIC
Bilirubin Urine: NEGATIVE
Glucose, UA: NEGATIVE mg/dL
Hgb urine dipstick: NEGATIVE
Ketones, ur: 5 mg/dL — AB
Leukocytes,Ua: NEGATIVE
Nitrite: NEGATIVE
Protein, ur: NEGATIVE mg/dL
Specific Gravity, Urine: 1.006 (ref 1.005–1.030)
pH: 6 (ref 5.0–8.0)

## 2023-09-22 LAB — COMPREHENSIVE METABOLIC PANEL
ALT: 13 U/L (ref 0–44)
AST: 24 U/L (ref 15–41)
Albumin: 3.7 g/dL (ref 3.5–5.0)
Alkaline Phosphatase: 87 U/L (ref 38–126)
Anion gap: 14 (ref 5–15)
BUN: 18 mg/dL (ref 8–23)
CO2: 19 mmol/L — ABNORMAL LOW (ref 22–32)
Calcium: 9.4 mg/dL (ref 8.9–10.3)
Chloride: 102 mmol/L (ref 98–111)
Creatinine, Ser: 0.87 mg/dL (ref 0.44–1.00)
GFR, Estimated: >60 mL/min (ref >60)
Glucose, Bld: 164 mg/dL — ABNORMAL HIGH (ref 70–99)
Potassium: 4.6 mmol/L (ref 3.5–5.1)
Sodium: 135 mmol/L (ref 135–145)
Total Bilirubin: 0.6 mg/dL (ref 0.0–1.2)
Total Protein: 7.9 g/dL (ref 6.5–8.1)

## 2023-09-22 MED ORDER — KETOROLAC TROMETHAMINE 30 MG/ML IJ SOLN
15.0000 mg | Freq: Once | INTRAMUSCULAR | Status: AC
  Administered 2023-09-22: 15 mg via INTRAVENOUS
  Filled 2023-09-22: qty 1

## 2023-09-22 MED ORDER — HYDROMORPHONE HCL 1 MG/ML IJ SOLN
0.5000 mg | Freq: Once | INTRAMUSCULAR | Status: AC
  Administered 2023-09-22: 0.5 mg via INTRAVENOUS
  Filled 2023-09-22: qty 1

## 2023-09-22 MED ORDER — NAPROXEN 375 MG PO TABS: 375.0000 mg | ORAL_TABLET | Freq: Two times a day (BID) | ORAL | 0 refills | Status: DC

## 2023-09-22 MED ORDER — SODIUM CHLORIDE (PF) 0.9 % IJ SOLN
INTRAMUSCULAR | Status: AC
  Filled 2023-09-22: qty 50

## 2023-09-22 MED ORDER — MORPHINE SULFATE (PF) 4 MG/ML IV SOLN
4.0000 mg | Freq: Once | INTRAVENOUS | Status: AC
  Administered 2023-09-22: 4 mg via INTRAVENOUS
  Filled 2023-09-22: qty 1

## 2023-09-22 MED ORDER — IOHEXOL 300 MG/ML  SOLN: 100.0000 mL | Freq: Once | INTRAMUSCULAR | Status: DC | PRN

## 2023-09-22 MED ORDER — OXYCODONE-ACETAMINOPHEN 5-325 MG PO TABS: 1.0000 | ORAL_TABLET | ORAL | 0 refills | Status: AC | PRN

## 2023-09-22 MED ORDER — IOHEXOL 350 MG/ML SOLN
100.0000 mL | Freq: Once | INTRAVENOUS | Status: AC | PRN
  Administered 2023-09-22: 100 mL via INTRAVENOUS

## 2023-09-22 NOTE — ED Provider Notes (Signed)
[place] EMERGENCY DEPARTMENT AT [facility] Provider Note   CSN: [id number] Arrival date & time: 09/22/23  1308     History  Chief Complaint  Patient presents with   Vaginal Prolapse    Mariah King is a 83 y.o. female.  Patient complains of severe abdominal pain.  Patient reports that she saw her primary care doctor and was diagnosed with a prolapsed bladder.  Patient was seen by her primary care provider.  Patient states that she began having severe pain today.  Patient reports that she saw her doctor because she was having pressure in the pelvic area.  Patient denies any fever or chills.  Patient denies any urinary incontinence.  Patient denies any burning with urination.  Patient denies any diarrhea or constipation.  The history is provided by the patient.       Home Medications Prior to Admission medications   Medication Sig Start Date End Date Taking? Authorizing Provider  acetaminophen (TYLENOL) 325 MG tablet Take 650 mg by mouth every 6 (six) hours as needed for mild pain or headache.    [provider]  ADVAIR DISKUS 250-50 MCG/DOSE AEPB Inhale 1 puff into the lungs daily. 09/06/19   [provider]  FLUoxetine (PROZAC) 40 MG capsule Take 40 mg by mouth every morning.     [provider]  LORazepam (ATIVAN) 0.5 MG tablet Take 0.5 mg by mouth every morning.     [provider]  mirtazapine (REMERON) 15 MG tablet Take 15 mg by mouth at bedtime.    [provider]  mometasone-formoterol (DULERA) 100-5 MCG/ACT AERO Inhale 2 puffs into the lungs 2 (two) times daily. Patient not taking: Reported on 09/26/2019 06/22/18   Nyoka Cowden, MD  montelukast (SINGULAIR) 10 MG tablet Take 1 tablet (10 mg total) by mouth every morning. Patient not taking: Reported on 09/26/2019 06/22/18   Nyoka Cowden, MD  pantoprazole (PROTONIX) 40 MG tablet Take 30- 60 min before your first and last meals of the day Patient not taking:  Reported on 09/26/2019 06/01/18   Nyoka Cowden, MD  Vitamin D, Ergocalciferol, (DRISDOL) 1.25 MG (50000 UNIT) CAPS capsule Take 50,000 Units by mouth once a week. 07/06/19   [provider]      Allergies    Sulfa antibiotics    Review of Systems   Review of Systems  Genitourinary:  Positive for pelvic pain.  All other systems reviewed and are negative.   Physical Exam Updated Vital Signs BP 133/77   Pulse 76   Temp 97.7 F (36.5 C) (Oral)   Wt 63 kg   SpO2 94%   BMI 23.84 kg/m  Physical Exam Vitals and nursing note reviewed.  Constitutional:      Appearance: She is well-developed.  HENT:     Head: Normocephalic.  Cardiovascular:     Rate and Rhythm: Normal rate.  Pulmonary:     Effort: Pulmonary effort is normal.  Abdominal:     General: There is no distension.  Genitourinary:    General: Normal vulva.     Comments: No prolapse seen on exam. Musculoskeletal:        General: Normal range of motion.     Cervical back: Normal range of motion.  Skin:    General: Skin is warm.  Neurological:     General: No focal deficit present.     Mental Status: She is alert and oriented to person, place, and time.  ED Results / Procedures / Treatments   Labs (all labs ordered are listed, but only abnormal results are displayed) Labs Reviewed  CBC WITH DIFFERENTIAL/PLATELET - Abnormal; Notable for the following components:      Result Value   WBC 13.9 (*)    Neutro Abs 7.9 (*)    Lymphs Abs 4.8 (*)    Abs Immature Granulocytes 0.09 (*)    All other components within normal limits  COMPREHENSIVE METABOLIC PANEL - Abnormal; Notable for the following components:   CO2 19 (*)    Glucose, Bld 164 (*)    All other components within normal limits  URINALYSIS, ROUTINE W REFLEX MICROSCOPIC - Abnormal; Notable for the following components:   Color, Urine STRAW (*)    Ketones, ur 5 (*)    All other components within normal limits    EKG None  Radiology CT  Angio Abd/Pel W and/or Wo Contrast Result Date: 09/22/2023 CLINICAL DATA:  Mesenteric ischemia. Pain. History of vaginal prolapse EXAM: CTA ABDOMEN AND PELVIS WITHOUT AND WITH CONTRAST TECHNIQUE: Multidetector CT imaging of the abdomen and pelvis was performed using the standard protocol during bolus administration of intravenous contrast. Multiplanar reconstructed images and MIPs were obtained and reviewed to evaluate the vascular anatomy. RADIATION DOSE REDUCTION: This exam was performed according to the departmental dose-optimization program which includes automated exposure control, adjustment of the mA and/or kV according to patient size and/or use of iterative reconstruction technique. CONTRAST:  OMNIPAQUE IOHEXOL 350 MG/ML SOLN COMPARISON:  Standard CT 02/01/2017 FINDINGS: VASCULAR Aorta: Slight atherosclerotic changes along the aorta. No dissection or aneurysm formation. Celiac: Patent without evidence of aneurysm, dissection, vasculitis or significant stenosis. SMA: Patent without evidence of aneurysm, dissection, vasculitis or significant stenosis. Renals: Both renal arteries are patent without evidence of aneurysm, dissection, vasculitis, fibromuscular dysplasia or significant stenosis. IMA: Patent without evidence of aneurysm, dissection, vasculitis or significant stenosis. Inflow: Patent without evidence of aneurysm, dissection, vasculitis or significant stenosis. Proximal Outflow: Bilateral common femoral and visualized portions of the superficial and profunda femoral arteries are patent without evidence of aneurysm, dissection, vasculitis or significant stenosis. Veins: No obvious venous abnormality within the limitations of this arterial phase study. Review of the MIP images confirms the above findings. NON-VASCULAR Lower chest: There is some linear opacity seen along bases likely scar or atelectasis. No pleural effusion. Hepatobiliary: Fatty liver infiltration identified. Tiny low-attenuation  lesion in segment 6 inferiorly, too small to completely characterize but likely a benign cystic lesion and no specific imaging follow up. Patent portal vein. Gallbladder is nondilated. Pancreas: Unremarkable. No pancreatic ductal dilatation or surrounding inflammatory changes. Spleen: Normal in size without focal abnormality. Adrenals/Urinary Tract: Adrenal glands are preserved. Multiple bilateral parapelvic renal cysts. Mild bilateral renal atrophy. No collecting system dilatation. The ureters have normal course and caliber extending down to the relatively underdistended urinary bladder. Stomach/Bowel: On this non oral contrast exam, the large bowel has a normal course and caliber. Diffuse colonic diverticulosis. Scattered stool. Normal appendix. Stomach is contracted. The small bowel overall is nondilated. There is a segment of small bowel stool appearance in the anterior central pelvis. No areas of pneumatosis, wall thickening or adjacent inflammatory changes. Lymphatic: Aortic atherosclerosis. No enlarged abdominal or pelvic lymph nodes. Reproductive: Heterogeneous uterus. Possible fibroids. No adnexal mass. Again changes suggestive of some prolapse of fat along the pelvic floor. Other: No free air or free fluid. Small fat containing umbilical hernia. Musculoskeletal: Scattered degenerative changes of the spine and pelvis. IMPRESSION: VASCULAR Mild  atherosclerotic changes. No dissection or aneurysm formation. Central mesenteric vessels are grossly patent. NON-VASCULAR Colonic diverticulosis. No obstruction, free air or free fluid. There is also small segment of small bowel stool appearance in the anterior pelvis without dilatation, inflammatory changes. Bilateral mild renal atrophy with multiple parapelvic renal cysts. Fatty liver infiltration. Electronically Signed   By: Karen Kays M.D.   On: 09/22/2023 13:40    Procedures Procedures    Medications Ordered in ED Medications  iohexol (OMNIPAQUE) 300  MG/ML solution 100 mL (has no administration in time range)  ketorolac (TORADOL) 30 MG/ML injection 15 mg (has no administration in time range)  morphine (PF) 4 MG/ML injection 4 mg (4 mg Intravenous Given 09/22/23 1041)  HYDROmorphone (DILAUDID) injection 0.5 mg (0.5 mg Intravenous Given 09/22/23 1108)  HYDROmorphone (DILAUDID) injection 0.5 mg (0.5 mg Intravenous Given 09/22/23 1204)  iohexol (OMNIPAQUE) 350 MG/ML injection 100 mL (100 mLs Intravenous Contrast Given 09/22/23 1259)    ED Course/ Medical Decision Making/ A&P                                 Medical Decision Making Patient complains of pelvic pain.  Patient crying and complaining of severe pain when she arrived to the emergency department.  Amount and/or Complexity of Data Reviewed Independent Historian: caregiver    Details: Patient's daughter arrives and gives additional history that she spoke to patient's doctor's office today.  They report patient is being scheduled to see gynecology for evaluation. Labs: ordered. Decision-making details documented in ED Course.    Details: Labs ordered reviewed and interpreted.  Patient has a 13.9. glucose is 164 Radiology: ordered and independent interpretation performed. Decision-making details documented in ED Course.    Details: Ct scan shows fibroids,  fat prolapse,   Risk Prescription drug management. Risk Details: Dr. Rhae Hammock in to see and examine            Final Clinical Impression(s) / ED Diagnoses Final diagnoses:  Fibroid    Rx / DC Orders ED Discharge Orders          Ordered    naproxen (NAPROSYN) 375 MG tablet  2 times daily with meals        09/22/23 1523    oxyCODONE-acetaminophen (PERCOCET) 5-325 MG tablet  Every 4 hours PRN        09/22/23 1527           An After Visit Summary was printed and given to the patient.    Elson Areas, [place] 09/22/23 1529    Durwin Glaze, MD 10/01/23 [phone]

## 2023-09-22 NOTE — ED Triage Notes (Signed)
Here by POV from home for vaginal pain r/t prolapsed uterus. Seen by PCP Dr. Eloise Harman on Wednesday, 6d ago. Has not seen GU yet. Pending GU appt. C/o pain. Denies other sx. Writhing and shaking in pain, crying.

## 2023-09-22 NOTE — ED Notes (Signed)
Pt in extreme pain, EDP notified

## 2023-09-22 NOTE — ED Notes (Signed)
Pt still in 10/10 pain. EDP notified.

## 2023-09-23 ENCOUNTER — Observation Stay (HOSPITAL_COMMUNITY)
Admission: EM | Admit: 2023-09-23 | Discharge: 2023-09-26 | DRG: 690 | Disposition: A | Discharge status: Home / Self Care | Payer: Medicare Other | Attending: Internal Medicine | Admitting: Internal Medicine

## 2023-09-23 ENCOUNTER — Encounter (HOSPITAL_COMMUNITY): Payer: Self-pay

## 2023-09-23 ENCOUNTER — Other Ambulatory Visit: Payer: Self-pay

## 2023-09-23 ENCOUNTER — Emergency Department (HOSPITAL_COMMUNITY): Payer: Medicare Other

## 2023-09-23 DIAGNOSIS — Z7984 Long term (current) use of oral hypoglycemic drugs: Secondary | ICD-10-CM

## 2023-09-23 DIAGNOSIS — R8290 Unspecified abnormal findings in urine: Secondary | ICD-10-CM | POA: Insufficient documentation

## 2023-09-23 DIAGNOSIS — Z79899 Other long term (current) drug therapy: Secondary | ICD-10-CM

## 2023-09-23 DIAGNOSIS — Z882 Allergy status to sulfonamides status: Secondary | ICD-10-CM

## 2023-09-23 DIAGNOSIS — R1030 Lower abdominal pain, unspecified: Secondary | ICD-10-CM | POA: Diagnosis not present

## 2023-09-23 DIAGNOSIS — D72829 Elevated white blood cell count, unspecified: Secondary | ICD-10-CM

## 2023-09-23 DIAGNOSIS — R102 Pelvic and perineal pain: Secondary | ICD-10-CM | POA: Insufficient documentation

## 2023-09-23 DIAGNOSIS — D259 Leiomyoma of uterus, unspecified: Secondary | ICD-10-CM | POA: Diagnosis present

## 2023-09-23 DIAGNOSIS — F32A Depression, unspecified: Secondary | ICD-10-CM

## 2023-09-23 DIAGNOSIS — E119 Type 2 diabetes mellitus without complications: Secondary | ICD-10-CM

## 2023-09-23 DIAGNOSIS — N390 Urinary tract infection, site not specified: Principal | ICD-10-CM

## 2023-09-23 DIAGNOSIS — D75839 Thrombocytosis, unspecified: Secondary | ICD-10-CM

## 2023-09-23 DIAGNOSIS — Z7951 Long term (current) use of inhaled steroids: Secondary | ICD-10-CM

## 2023-09-23 DIAGNOSIS — G4700 Insomnia, unspecified: Secondary | ICD-10-CM | POA: Diagnosis present

## 2023-09-23 DIAGNOSIS — J45909 Unspecified asthma, uncomplicated: Secondary | ICD-10-CM | POA: Diagnosis present

## 2023-09-23 DIAGNOSIS — I700 Atherosclerosis of aorta: Secondary | ICD-10-CM | POA: Diagnosis present

## 2023-09-23 DIAGNOSIS — N179 Acute kidney failure, unspecified: Principal | ICD-10-CM

## 2023-09-23 DIAGNOSIS — Z87891 Personal history of nicotine dependence: Secondary | ICD-10-CM

## 2023-09-23 DIAGNOSIS — E872 Acidosis, unspecified: Secondary | ICD-10-CM | POA: Diagnosis present

## 2023-09-23 DIAGNOSIS — E871 Hypo-osmolality and hyponatremia: Secondary | ICD-10-CM | POA: Diagnosis present

## 2023-09-23 DIAGNOSIS — N888 Other specified noninflammatory disorders of cervix uteri: Secondary | ICD-10-CM | POA: Diagnosis not present

## 2023-09-23 DIAGNOSIS — E8721 Acute metabolic acidosis: Secondary | ICD-10-CM | POA: Diagnosis present

## 2023-09-23 DIAGNOSIS — R319 Hematuria, unspecified: Secondary | ICD-10-CM

## 2023-09-23 LAB — I-STAT CG4 LACTIC ACID, ED
Lactic Acid, Venous: 3 mmol/L (ref 0.5–1.9)
Lactic Acid, Venous: 2.1 mmol/L (ref 0.5–1.9)

## 2023-09-23 LAB — CBC WITH DIFFERENTIAL/PLATELET
Abs Immature Granulocytes: 0.09 10*3/uL — ABNORMAL HIGH (ref 0.00–0.07)
Basophils Absolute: 0.1 10*3/uL (ref 0.0–0.1)
Basophils Relative: 1 %
Eosinophils Absolute: 0.2 10*3/uL (ref 0.0–0.5)
Eosinophils Relative: 2 %
HCT: 41.5 % (ref 36.0–46.0)
Hemoglobin: 14.3 g/dL (ref 12.0–15.0)
Immature Granulocytes: 1 %
Lymphocytes Relative: 28 %
Lymphs Abs: 4.1 10*3/uL — ABNORMAL HIGH (ref 0.7–4.0)
MCH: 29.7 pg (ref 26.0–34.0)
MCHC: 34.5 g/dL (ref 30.0–36.0)
MCV: 86.3 fL (ref 80.0–100.0)
Monocytes Absolute: 1 10*3/uL (ref 0.1–1.0)
Monocytes Relative: 7 %
Neutro Abs: 9 10*3/uL — ABNORMAL HIGH (ref 1.7–7.7)
Neutrophils Relative %: 61 %
Platelets: 502 10*3/uL — ABNORMAL HIGH (ref 150–400)
RBC: 4.81 MIL/uL (ref 3.87–5.11)
RDW: 15.2 % (ref 11.5–15.5)
WBC: 14.5 10*3/uL — ABNORMAL HIGH (ref 4.0–10.5)
nRBC: 0 % (ref 0.0–0.2)

## 2023-09-23 LAB — URINALYSIS, ROUTINE W REFLEX MICROSCOPIC
Bilirubin Urine: NEGATIVE
Glucose, UA: NEGATIVE mg/dL
Hgb urine dipstick: NEGATIVE
Ketones, ur: 5 mg/dL — AB
Nitrite: NEGATIVE
Protein, ur: NEGATIVE mg/dL
Specific Gravity, Urine: >1.046 — ABNORMAL HIGH (ref 1.005–1.030)
pH: 5 (ref 5.0–8.0)

## 2023-09-23 LAB — WET PREP, GENITAL
Clue Cells Wet Prep HPF POC: NONE SEEN
Sperm: NONE SEEN
Trich, Wet Prep: NONE SEEN
WBC, Wet Prep HPF POC: >=10 — AB (ref <10)
Yeast Wet Prep HPF POC: NONE SEEN

## 2023-09-23 LAB — COMPREHENSIVE METABOLIC PANEL
ALT: 13 U/L (ref 0–44)
AST: 23 U/L (ref 15–41)
Albumin: 3.2 g/dL — ABNORMAL LOW (ref 3.5–5.0)
Alkaline Phosphatase: 78 U/L (ref 38–126)
Anion gap: 16 — ABNORMAL HIGH (ref 5–15)
BUN: 21 mg/dL (ref 8–23)
CO2: 18 mmol/L — ABNORMAL LOW (ref 22–32)
Calcium: 8.6 mg/dL — ABNORMAL LOW (ref 8.9–10.3)
Chloride: 100 mmol/L (ref 98–111)
Creatinine, Ser: 1.32 mg/dL — ABNORMAL HIGH (ref 0.44–1.00)
GFR, Estimated: 40 mL/min — ABNORMAL LOW (ref >60)
Glucose, Bld: 144 mg/dL — ABNORMAL HIGH (ref 70–99)
Potassium: 3.8 mmol/L (ref 3.5–5.1)
Sodium: 134 mmol/L — ABNORMAL LOW (ref 135–145)
Total Bilirubin: 0.5 mg/dL (ref 0.0–1.2)
Total Protein: 6.6 g/dL (ref 6.5–8.1)

## 2023-09-23 LAB — LACTIC ACID, PLASMA: Lactic Acid, Venous: 1 mmol/L (ref 0.5–1.9)

## 2023-09-23 LAB — LIPASE, BLOOD: Lipase: 37 U/L (ref 11–51)

## 2023-09-23 MED ORDER — ACETAMINOPHEN 650 MG RE SUPP: 650.0000 mg | Freq: Four times a day (QID) | RECTAL | Status: DC | PRN

## 2023-09-23 MED ORDER — SODIUM CHLORIDE 0.9 % IV SOLN
INTRAVENOUS | Status: DC
Start: 2023-09-23 — End: 2023-09-24

## 2023-09-23 MED ORDER — ALBUTEROL SULFATE (2.5 MG/3ML) 0.083% IN NEBU: 2.5000 mg | INHALATION_SOLUTION | Freq: Four times a day (QID) | RESPIRATORY_TRACT | Status: DC | PRN

## 2023-09-23 MED ORDER — SODIUM CHLORIDE 0.9% FLUSH
3.0000 mL | Freq: Two times a day (BID) | INTRAVENOUS | Status: DC
  Administered 2023-09-23 – 2023-09-25 (×4): 3 mL via INTRAVENOUS

## 2023-09-23 MED ORDER — ENOXAPARIN SODIUM 40 MG/0.4ML IJ SOSY
40.0000 mg | PREFILLED_SYRINGE | INTRAMUSCULAR | Status: DC
  Administered 2023-09-23 – 2023-09-25 (×3): 40 mg via SUBCUTANEOUS
  Filled 2023-09-23 (×3): qty 0.4

## 2023-09-23 MED ORDER — SODIUM CHLORIDE 0.9 % IV SOLN
1.0000 g | INTRAVENOUS | Status: DC
  Administered 2023-09-23 – 2023-09-25 (×3): 1 g via INTRAVENOUS
  Filled 2023-09-23 (×3): qty 10

## 2023-09-23 MED ORDER — MORPHINE SULFATE (PF) 2 MG/ML IV SOLN
4.0000 mg | Freq: Once | INTRAVENOUS | Status: AC
  Administered 2023-09-23: 4 mg via INTRAVENOUS
  Filled 2023-09-23: qty 2

## 2023-09-23 MED ORDER — ACETAMINOPHEN 325 MG PO TABS
650.0000 mg | ORAL_TABLET | Freq: Four times a day (QID) | ORAL | Status: DC | PRN
Start: 2023-09-23 — End: 2023-09-26
  Administered 2023-09-23: 650 mg via ORAL
  Filled 2023-09-23: qty 2

## 2023-09-23 MED ORDER — HYDROMORPHONE HCL 1 MG/ML IJ SOLN
0.5000 mg | Freq: Once | INTRAMUSCULAR | Status: AC
Start: 2023-09-23 — End: 2023-09-23
  Administered 2023-09-23: 0.5 mg via INTRAVENOUS
  Filled 2023-09-23: qty 1

## 2023-09-23 MED ORDER — HYDROMORPHONE HCL 1 MG/ML IJ SOLN
0.5000 mg | INTRAMUSCULAR | Status: DC | PRN
  Administered 2023-09-23: 0.5 mg via INTRAVENOUS
  Filled 2023-09-23 (×3): qty 1

## 2023-09-23 MED ORDER — HYDROMORPHONE HCL 1 MG/ML IJ SOLN
0.5000 mg | Freq: Once | INTRAMUSCULAR | Status: AC
Start: 2023-09-23 — End: 2023-09-23
  Administered 2023-09-23: 0.5 mg via INTRAVENOUS

## 2023-09-23 MED ORDER — LORAZEPAM 0.5 MG PO TABS
0.5000 mg | ORAL_TABLET | Freq: Every day | ORAL | Status: DC | PRN
  Administered 2023-09-23 – 2023-09-24 (×2): 0.5 mg via ORAL
  Filled 2023-09-23 (×2): qty 1

## 2023-09-23 MED ORDER — LACTATED RINGERS IV BOLUS
500.0000 mL | Freq: Once | INTRAVENOUS | Status: AC
  Administered 2023-09-23: 500 mL via INTRAVENOUS

## 2023-09-23 MED ORDER — OXYCODONE-ACETAMINOPHEN 5-325 MG PO TABS
1.0000 | ORAL_TABLET | Freq: Four times a day (QID) | ORAL | Status: DC | PRN
  Administered 2023-09-23: 1 via ORAL
  Filled 2023-09-23: qty 1

## 2023-09-23 MED ORDER — FLUOXETINE HCL 20 MG PO CAPS
40.0000 mg | ORAL_CAPSULE | Freq: Every morning | ORAL | Status: DC
  Administered 2023-09-24 – 2023-09-26 (×3): 40 mg via ORAL
  Filled 2023-09-23 (×3): qty 2

## 2023-09-23 MED ORDER — MOMETASONE FURO-FORMOTEROL FUM 200-5 MCG/ACT IN AERO
2.0000 | INHALATION_SPRAY | Freq: Two times a day (BID) | RESPIRATORY_TRACT | Status: DC
  Administered 2023-09-24 – 2023-09-26 (×4): 2 via RESPIRATORY_TRACT
  Filled 2023-09-23: qty 8.8

## 2023-09-23 NOTE — ED Notes (Signed)
Light green sent to lab

## 2023-09-23 NOTE — ED Notes (Signed)
Floor unit secretary notified that patient is being transported upstairs.

## 2023-09-23 NOTE — Plan of Care (Signed)

## 2023-09-23 NOTE — H&P (Addendum)
History and Physical    Patient: Mariah King TFT:[id number] DOB: [birth year] DOA: 09/23/2023 DOS: the patient was seen and examined on 09/23/2023 PCP: Garlan Fillers, MD  Patient coming from: Home  Chief Complaint:  Chief Complaint  Patient presents with   Abdominal Pain   HPI: Mariah King is a 83 y.o. female with medical history significant of asthma and depression who presented with severe lower abdominal and pelvic pain. The pain onset was approximately a week ago, prompting a visit to her primary care provider at [facility]. During that visit, a vaginal prolapse was identified and manually reduced in the office. However, no pain management was provided, leading the patient to self-medicate with Tylenol.  Despite the intervention, the patient's pain persisted and even escalated, particularly during the night. This led to an initial visit to the [facility] ER, where she was initially evaluated.  CT angiogram of the abdomen and pelvis which noted mild atherosclerotic changes with no dissection or aneurysm formation, colonic diverticulosis without obstruction, and there was a small segment of small bowel stool appearance in the anterior pelvis without dilation or inflammatory changes, bilateral mild renal atrophy with multiple parapelvic renal cyst, and fatty liver infiltration.  She was given pain medications with improvement in symptoms, and subsequently discharged with a prescription for Percocet.    However, within a day, the patient's pain returned and intensified, leading to a second ER visit. The pain was so severe that it caused the patient to cry and scream, and she requested additional pain medication within three hours of her last dose. The patient's primary care provider was contacted, who advised another ER visit and an urgent GYN consult.  The patient's daughter attempted to schedule a GYN consult with the referred specialist, but the earliest available appointment was  in March, which was deemed unacceptable given the patient's age and severity of symptoms. The patient was then brought back to the ER for further management.  In the emergency department patient was noted to be afebrile with mild tachypnea and blood pressures elevated up to 176/98, and O2 saturation maintained on room air.  Labs significant for WBC 14.5, platelets 502, sodium 134, BUN 21, creatinine 1.32, anion gap 16, and lactic acid 3->2.1.  Patient had transvaginal ultrasound which noted to be limited with poor visualization of the endometrium and ovaries but there were uterine fibroids present.  Blood cultures have been obtained.  Patient had been given 500 mL of normal saline IV fluids, morphine IV, and Dilaudid IV without good pain control.   Review of Systems: As mentioned in the history of present illness. All other systems reviewed and are negative. Past Medical History:  Diagnosis Date   Asthma    Depression    Past Surgical History:  Procedure Laterality Date   TONSILLECTOMY     TUBAL LIGATION     Social History:  reports that she quit smoking about 65 years ago. Her smoking use included cigarettes. She started smoking about 70 years ago. She has a 1.3 pack-year smoking history. She has never used smokeless tobacco. She reports that she does not drink alcohol and does not use drugs.  Allergies  Allergen Reactions   Sulfa Antibiotics Other (See Comments) and Rash    Childhood, unknown reaction    Family History  Problem Relation Age of Onset   Lung cancer Mother        never smoked   Celiac disease Sister     Prior to Admission  medications   Medication Sig Start Date End Date Taking? Authorizing Provider  acetaminophen (TYLENOL) 325 MG tablet Take 650 mg by mouth every 6 (six) hours as needed for mild pain or headache.   Yes [provider]  BREYNA 160-4.5 MCG/ACT inhaler Inhale 1 puff into the lungs 2 (two) times daily. 08/09/23  Yes [provider]   colchicine 0.6 MG tablet TAKE 1 TABLET BY MOUTH TWICE DAILY FOR 3 DAYS AND 1 ONCE DAILY FOR UP TO A WEEK AS NEEDED FOR GOUT 08/03/23  Yes [provider]  FLUoxetine (PROZAC) 40 MG capsule Take 40 mg by mouth every morning.    Yes [provider]  metFORMIN (GLUCOPHAGE-XR) 500 MG 24 hr tablet Take 500 mg by mouth daily. 06/22/23  Yes [provider]  naproxen (NAPROSYN) 375 MG tablet Take 1 tablet (375 mg total) by mouth 2 (two) times daily with a meal. 09/22/23  Yes Cheron Schaumann K, PA-C  oxyCODONE-acetaminophen (PERCOCET) 5-325 MG tablet Take 1 tablet by mouth every 4 (four) hours as needed for up to 5 days for severe pain (pain score 7-10). 09/22/23 09/27/23 Yes Elson Areas, PA-C  Vitamin D, Ergocalciferol, (DRISDOL) 1.25 MG (50000 UNIT) CAPS capsule Take 50,000 Units by mouth once a week. 07/06/19  Yes [provider]  ADVAIR DISKUS 250-50 MCG/DOSE AEPB Inhale 1 puff into the lungs daily. Patient not taking: Reported on 09/23/2023 09/06/19   [provider]  LORazepam (ATIVAN) 0.5 MG tablet Take 0.5 mg by mouth every morning.  Patient not taking: Reported on 09/23/2023    [provider]  mirtazapine (REMERON) 15 MG tablet Take 15 mg by mouth at bedtime. Patient not taking: Reported on 09/23/2023    [provider]  mometasone-formoterol (DULERA) 100-5 MCG/ACT AERO Inhale 2 puffs into the lungs 2 (two) times daily. Patient not taking: Reported on 09/23/2023 06/22/18   Nyoka Cowden, MD  montelukast (SINGULAIR) 10 MG tablet Take 1 tablet (10 mg total) by mouth every morning. Patient not taking: Reported on 09/23/2023 06/22/18   Nyoka Cowden, MD  ondansetron (ZOFRAN) 4 MG tablet Take 4 mg by mouth once. A DAY FOR 20 DAYS AS NEEDED FOR NAUSEA Patient not taking: Reported on 09/23/2023 09/23/23   [provider]  pantoprazole (PROTONIX) 40 MG tablet Take 30- 60 min before your first and last meals of the day Patient not taking:  Reported on 09/23/2023 06/01/18   Nyoka Cowden, MD    Physical Exam: Vitals:   09/23/23 0656 09/23/23 0900 09/23/23 0915 09/23/23 1015  BP:  (!) 151/100 (!) 149/113 (!) 145/63  Pulse:  87 94 72  Resp:  (!) 22 17   Temp:      SpO2:  99% 99% 97%  Weight: 63 kg     Height: 5\' 4"  (1.626 m)      Constitutional: Early female who is currently resting. Eyes: PERRL, lids and conjunctivae normal ENMT: Mucous membranes are moist. Posterior pharynx clear of any exudate or lesions.  Neck: normal, supple  Respiratory: clear to auscultation bilaterally, no wheezing, no crackles. Normal respiratory effort. No accessory muscle use.  Cardiovascular: Regular rate and rhythm, no murmurs / rubs / gallops. No extremity edema. 2+ pedal pulses. No carotid bruits.  Abdomen: Suprapubic tenderness to palpation.  No hepatosplenomegaly. Bowel sounds positive.  Musculoskeletal: no clubbing / cyanosis. No joint deformity upper and lower extremities. Good ROM, no contractures. Normal muscle tone.  Skin: no rashes, lesions, ulcers. No induration  Neurologic: CN 2-12 grossly intact. Sensation intact, DTR normal. Strength 5/5 in all 4.  Psychiatric: Normal judgment and insight. Alert and oriented x 3. Normal mood.   Data Reviewed:   Reviewed labs, imaging, and pertinent records as documented.  Assessment and Plan: Lower abdominal pain Possible urinary tract infection Acute.  Presents with complaints of recurrent severe pelvic pain.  Patient had reported having initial visit with her primary on 1/14 noted to have concern for vaginal prolapse.  Subsequently seen in the ED yesterday for symptoms but CT angio of the abdomen pelvis negative for any acute abnormality.  Urinalysis had initially been negative on 1/18. OB/GYN had been formally consulted, but felt vaginal prolapse or any gynecological reason for her symptoms seem to be less likely.   -Admit to a medical telemetry bed -Recheck urinalysis(noted trace  leukocytes, negative nitrites, rare bacteria, and 6-10 WBCs.) -Oxycodone/Dilaudid IV as needed for moderate to severe pain respectively -Start empiric antibiotics of Rocephin -Appreciate OB/GYN consultative services  Leukocytosis Acute on chronic.  White blood cell count elevated at 14.5 which is slightly higher than prior.  Question possibility of underlying infection. -Recheck CBC tomorrow morning  Lactic acidosis Metabolic acidosis with elevated anion gap Acute.  Initial lactic acid elevated up to 3 with CO2 18 and anion gap 16.  After IV fluids repeat check 2.1.  Possibly related with patient being on metformin with recent IV contrast given or related to recent decreased p.o. intake -Continue to trend lactic acid level -Recheck BMP in a.m.  Acute kidney injury Creatinine noted to be elevated at 1.32 with BUN 21.  Creatinine previously had been 0.87 when checked on 1/21. -Monitor intake and output - normal saline IV fluids at 75 mL/h -Check kidney function in a.m.  Controlled diabetes mellitus type 2, without long-term use of insulin On admission glucose 144.  Patient on metformin in the outpatient setting. -Hold metformin -Carb modified diet  Depression -Continue Prozac  Thrombocytosis Acute.  Platelet count elevated at 502.  Thought reactive in nature. -Check CBC in a.m  DVT prophylaxis: Lovenox Advance Care Planning:   Code Status: Full Code    Consults: OB/GYN  Family Communication: Patient daughter updated at bedside who lives in neurology  Severity of Illness: The appropriate patient status for this patient is OBSERVATION. Observation status is judged to be reasonable and necessary in order to provide the required intensity of service to ensure the patient's safety. The patient's presenting symptoms, physical exam findings, and initial radiographic and laboratory data in the context of their medical condition is felt to place them at decreased risk for further  clinical deterioration. Furthermore, it is anticipated that the patient will be medically stable for discharge from the hospital within 2 midnights of admission.   Author: Clydie Braun, MD 09/23/2023 11:17 AM  For on call review [email or web address].

## 2023-09-23 NOTE — Consult Note (Signed)
   OB/GYN Telephone Consult  Mariah King is a 83 y.o. No obstetric history on file. presenting with lower abdominal pain.   I was called for a consult regarding the care of this patient by [facility].    The provider had a clinical question regarding her lower abdominal pain and for my perspective on her ultrasound.    The provider presented the following relevant clinical information and I performed a chart review on the patient and reviewed available documentation:  She has a history of prolapse per report but exam by PA yesterday was negative for significant prolpase. Her pain was noted to be severe in the ED yesterday as well as today. She has had imaging which has been negative.   I reviewed her pelvic ultrasound images independently and my findings are: She has a completely normal pelvic ultrasound besides two 1-2 cm fibroids. The endometrium is not well visualized as noted in both TA and TV views.  I reviewed CareEverywhere: noncontributory  BP (!) 145/63   Pulse 72   Temp 97.6 F (36.4 C)   Resp 17   Ht 5\' 4"  (1.626 m)   Wt 63 kg   SpO2 97%   BMI 23.84 kg/m   Exam- performed by consulting provider   Recommendations:  - Based on review of her labs (WBC 14), elevated lactic acid, Cr 1.31 when it was normal yesterday, I do not think this is gynecologic in origin. Prolapse does not cause pain unless severe enough to cause erosions which would have been apparent on exam. Additionally prolapse would not cause this degree of pain even in cases of complete procedentia. Her fibroids are too small to be symptomatic. She does not have an ovarian cyst or other ovarian pathology (nonvisualized ovaries) to warrant concern and in the absence of these findings, torsion would not be present.  - I do not know the source of pain but her labs are concerning for infection.   - If she developed bleeding, would advise EMB as an outpatient.  - If she has a sexual history  (still sexually active), I would advise GC/CT testing  Thank you for this consult and if additional recommendations are needed please call [phone] for the OB/GYN attending on service at [facility].   I spent approximately 5 minutes directly consulting with the provider and verbally discussing this case. Additionally 10 minutes minutes was spent performing chart review and documentation.   Milas Hock, MD Attending Obstetrician & Gynecologist, [facility] for [facility], [facility]

## 2023-09-23 NOTE — ED Triage Notes (Signed)
Pt coming in pov with daughter. Pt is complaining of severe abdominal pain. Pt took 2 percocet. Pt is dry heaving in triage. Pt is in 10/10 pain. Pt is unable to sit up. Pt family spoke with family provider who is concerned for a gyn concern. Pt family provider at [facility] could not get her in with an obgyn in the time frame they were hoping and would like her to be seen by gyn today per family.

## 2023-09-23 NOTE — ED Provider Triage Note (Signed)
Emergency Medicine Provider Triage Evaluation Note  Mariah King , a 83 y.o. female  was evaluated in triage.  Pt complains of pelvic pain that began yesterday.  Patient was seen at [facility] long last night had dissection study along with labs drawn that was ultimately unremarkable.  Patient does have history of fibroids and was told to follow-up with GYN.  Patient is taking 2 Percocets since being discharged last night and states this has not helped.  Patient states she still urinate but denies any chest pain shortness of breath or vomiting.  Review of Systems  Positive:  Negative:   Physical Exam  BP (!) 146/77   Pulse 89   Temp 97.6 F (36.4 C)   Resp 18   Ht 5\' 4"  (1.626 m)   Wt 63 kg   SpO2 97%   BMI 23.84 kg/m  Gen:   Awake, obvious distress Resp:  Normal effort  MSK:   Moves extremities without difficulty  Other:    Medical Decision Making  Medically screening exam initiated at 7:55 AM.  Appropriate orders placed.  Mariah King was informed that the remainder of the evaluation will be completed by another provider, this initial triage assessment does not replace that evaluation, and the importance of remaining in the ED until their evaluation is complete.  Workup initiated, patient did have full workup done last night however will add on pelvic ultrasound, meds ordered for patient's comfort as she does appear in distress, patient stable at this time.   Netta Corrigan, [place] 09/23/23 [id number]

## 2023-09-23 NOTE — Consult Note (Signed)
OBSTETRICS AND GYNECOLOGY ATTENDING CONSULT NOTE  Consult Date: 09/23/2023 Reason for Consult: Requested by family  Consulting Provider: Dr. Katrinka Blazing   Assessment/Plan: Lower abdominal pain/pressure - Based on exam and her lab/imaging findings, I do not think this is GYN in origin. Even in the setting of complete procidentia, prolapse does not cause pain and her prolapse is mild at the most. She does not have any adnexal findings to warrant concern for torsion. Her fibroids would be asymptomatic and would have been present for decades (they were noted on her CT in 2018 for instance).  Reviewed this with patient's daughter and provided reassurance.  - Checked wet prep as a precaution. No indication for GC/CT. If negative, primary team may reassure no GYN issue at this time.  - If she develops vaginal bleeding, I would then recommend EMB as an outpatient.    Appreciate care of [name] by her primary team  We will sign off at this time but please consult again at any time for new concerns.   Thank you for involving [place] in the care of this patient.  Total consultation time including face-to-face time with patient (>50% of time), reviewing chart and documentation: 45 minutes  Milas Hock MD, FACOG Obstetrician & Gynecologist, [facility] for [facility], [facility]  History of Present Illness: Mariah King is an 83 y.o. G1P1 female who was admitted for acute onset lower abdominal pain that feels like an intense pressure pain. Her daughter was the primary history as the patient had recently been given pain medication. Her daughter, Mariah King (who lives in [place]), reported that she developed some lower abdominal pain about one week ago when she say the PA at her PCP's office. She was diagnosed with prolapse and it was "pushed back up". She then woke up yesterday morning in severe pain. She informed her daughter and then drove herself to [facility] and her  daughter met her there. She had a workup which was negative per [facility] and was sent home on pain medication. She stayed with her mom overnight. Her mom woke up around 3 am and thought the pain might be coming on so she took some PO meds. Then around 5:30 in the morning she was in severe pain again. They called her PCP who recommended going back to the emergency room. Audrianna told Mariah King that the pain seems to come on at night worse. Mariah King denies her mother having any vaginal bleeding.   Mariah King reports her mother is very independent and lives alone. She is not sexually active.   Pertinent OB/GYN History: No LMP recorded. Patient is postmenopausal. OB History  No obstetric history on file.   No known GYN issues  Patient Active Problem List   Diagnosis Date Noted   Cough variant asthma vs uacs 05/04/2018   AKI (acute kidney injury) (HCC) 02/01/2017   Sepsis (HCC) 02/01/2017   Nausea vomiting and diarrhea 02/01/2017   Abdominal pain 02/01/2017   Asthma    Depression     Past Medical History:  Diagnosis Date   Asthma    Depression     Past Surgical History:  Procedure Laterality Date   TONSILLECTOMY     TUBAL LIGATION      Family History  Problem Relation Age of Onset   Lung cancer Mother        never smoked   Celiac disease Sister     Social History:  reports that she quit smoking about 30  years ago. Her smoking use included cigarettes. She started smoking about 70 years ago. She has a 1.3 pack-year smoking history. She has never used smokeless tobacco. She reports that she does not drink alcohol and does not use drugs.  Allergies:  Allergies  Allergen Reactions   Sulfa Antibiotics Other (See Comments) and Rash    Childhood, unknown reaction    Medications: I have reviewed the patient's current medications.  Review of Systems: Pertinent items are noted in HPI.  Focused Physical Examination: BP 138/65   Pulse 77   Temp 98.2 F (36.8 C) (Oral)   Resp (!) 21    Ht 5\' 4"  (1.626 m)   Wt 63 kg   SpO2 97%   BMI 23.84 kg/m  CONSTITUTIONAL: Well-developed, well-nourished female in no acute distress.  NEUROLOGIC:  Sedated by responsive PSYCHIATRIC: Normal mood and affect. Normal behavior. Normal judgment and thought content. CARDIOVASCULAR: Normal heart rate noted, regular rhythm RESPIRATORY: Clear to auscultation bilaterally. Effort and breath sounds normal, no problems with respiration noted. ABDOMEN: Soft, normal bowel sounds, no distention noted.  No tenderness, rebound or guarding.  PELVIC: Normal appearing external genitalia; normal appearing vaginal mucosa. Exam limited by patient's position, but no apparent prolapse. Normal appearing discharge that was minimal.  Normal uterine size, no other palpable masses, no uterine or adnexal tenderness. Her bladder and rectal area were both tender through the vaginal exam but review this is nonspecific. Done in the presence of a chaperone   Labs and Imaging: Results for orders placed or performed during the hospital encounter of 09/23/23 (from the past 72 hours)  CBC with Differential     Status: Abnormal   Collection Time: 09/23/23  8:04 AM  Result Value Ref Range   WBC 14.5 (H) 4.0 - 10.5 K/uL   RBC 4.81 3.87 - 5.11 MIL/uL   Hemoglobin 14.3 12.0 - 15.0 g/dL   HCT 29.5 62.1 - 30.8 %   MCV 86.3 80.0 - 100.0 fL   MCH 29.7 26.0 - 34.0 pg   MCHC 34.5 30.0 - 36.0 g/dL   RDW 65.7 84.6 - 96.2 %   Platelets 502 (H) 150 - 400 K/uL   nRBC 0.0 0.0 - 0.2 %   Neutrophils Relative % 61 %   Neutro Abs 9.0 (H) 1.7 - 7.7 K/uL   Lymphocytes Relative 28 %   Lymphs Abs 4.1 (H) 0.7 - 4.0 K/uL   Monocytes Relative 7 %   Monocytes Absolute 1.0 0.1 - 1.0 K/uL   Eosinophils Relative 2 %   Eosinophils Absolute 0.2 0.0 - 0.5 K/uL   Basophils Relative 1 %   Basophils Absolute 0.1 0.0 - 0.1 K/uL   Immature Granulocytes 1 %   Abs Immature Granulocytes 0.09 (H) 0.00 - 0.07 K/uL    Comment: Performed at [facility]  Lab, [address], [place], [place] [address]  I-Stat Lactic Acid     Status: Abnormal   Collection Time: 09/23/23  8:15 AM  Result Value Ref Range   Lactic Acid, Venous 3.0 (HH) 0.5 - 1.9 mmol/L   Comment NOTIFIED PHYSICIAN   Comprehensive metabolic panel     Status: Abnormal   Collection Time: 09/23/23  8:45 AM  Result Value Ref Range   Sodium 134 (L) 135 - 145 mmol/L   Potassium 3.8 3.5 - 5.1 mmol/L   Chloride 100 98 - 111 mmol/L   CO2 18 (L) 22 - 32 mmol/L   Glucose, Bld 144 (H) 70 - 99 mg/dL  Comment: Glucose reference range applies only to samples taken after fasting for at least 8 hours.   BUN 21 8 - 23 mg/dL   Creatinine, Ser 4.09 (H) 0.44 - 1.00 mg/dL   Calcium 8.6 (L) 8.9 - 10.3 mg/dL   Total Protein 6.6 6.5 - 8.1 g/dL   Albumin 3.2 (L) 3.5 - 5.0 g/dL   AST 23 15 - 41 U/L   ALT 13 0 - 44 U/L   Alkaline Phosphatase 78 38 - 126 U/L   Total Bilirubin 0.5 0.0 - 1.2 mg/dL   GFR, Estimated 40 (L) >60 mL/min    Comment: (NOTE) Calculated using the CKD-EPI Creatinine Equation (2021)    Anion gap 16 (H) 5 - 15    Comment: Performed at [facility] Lab, [address] [place], [place] [address]  Lipase, blood     Status: None   Collection Time: 09/23/23  8:45 AM  Result Value Ref Range   Lipase 37 11 - 51 U/L    Comment: Performed at [facility], [address] [place], [place] [address]  I-Stat Lactic Acid     Status: Abnormal   Collection Time: 09/23/23 10:52 AM  Result Value Ref Range   Lactic Acid, Venous 2.1 (HH) 0.5 - 1.9 mmol/L   Comment NOTIFIED PHYSICIAN   Urinalysis, Routine w reflex microscopic -Urine, Clean Catch     Status: Abnormal   Collection Time: 09/23/23  1:56 PM  Result Value Ref Range   Color, Urine YELLOW YELLOW   APPearance CLEAR CLEAR   Specific Gravity, Urine >1.046 (H) 1.005 - 1.030   pH 5.0 5.0 - 8.0   Glucose, UA NEGATIVE NEGATIVE mg/dL   Hgb urine dipstick NEGATIVE NEGATIVE   Bilirubin Urine NEGATIVE NEGATIVE   Ketones, ur 5 (A)  NEGATIVE mg/dL   Protein, ur NEGATIVE NEGATIVE mg/dL   Nitrite NEGATIVE NEGATIVE   Leukocytes,Ua TRACE (A) NEGATIVE   RBC / HPF 0-5 0 - 5 RBC/hpf   WBC, UA 6-10 0 - 5 WBC/hpf   Bacteria, UA RARE (A) NONE SEEN   Squamous Epithelial / HPF 0-5 0 - 5 /HPF   Mucus PRESENT     Comment: Performed at [facility], [address], [place], [place] [address]    US Pelvis Complete Result Date: 09/23/2023 CLINICAL DATA:  Pelvic pain EXAM: TRANSABDOMINAL AND TRANSVAGINAL ULTRASOUND OF PELVIS TECHNIQUE: Both transabdominal and transvaginal ultrasound examinations of the pelvis were performed. Transabdominal technique was performed for global imaging of the pelvis including uterus, ovaries, adnexal regions, and pelvic cul-de-sac. It was necessary to proceed with endovaginal exam following the transabdominal exam to visualize the endometrium and adnexa. COMPARISON:  None Available. FINDINGS: Uterus Measurements: 5.5 x 3.3 x 3.9 cm = volume: 37.8 mL. Multiple complex nabothian cysts. There is also a heterogeneous structure seen towards the uterine fundus anteriorly measuring 18 x 15 x 17 mm and adjacent focus measuring 15 x 14 x 16 mm consistent with fibroids. Endometrium Thickness: Obscured by the fibroids in the overlapping soft tissue Right ovary Obscured by bowel gas Left ovary Obscured by bowel gas. Other findings No abnormal free fluid. IMPRESSION: Very limited examination. Poor visualization of the endometrium and ovaries. There are uterine fibroids. If there is further concern of the endometrium and adnexa additional cross-sectional study could be considered as clinically appropriate such as female pelvic MRI. Please correlate with patient's CT angiogram of the abdomen and pelvis from 09/22/2023. Electronically Signed   By: Scarlette Shorts  Chales Abrahams M.D.   On: 09/23/2023 10:25   US Transvaginal Non-OB Result Date: 09/23/2023 CLINICAL DATA:  Pelvic pain EXAM: TRANSABDOMINAL AND TRANSVAGINAL ULTRASOUND OF PELVIS  TECHNIQUE: Both transabdominal and transvaginal ultrasound examinations of the pelvis were performed. Transabdominal technique was performed for global imaging of the pelvis including uterus, ovaries, adnexal regions, and pelvic cul-de-sac. It was necessary to proceed with endovaginal exam following the transabdominal exam to visualize the endometrium and adnexa. COMPARISON:  None Available. FINDINGS: Uterus Measurements: 5.5 x 3.3 x 3.9 cm = volume: 37.8 mL. Multiple complex nabothian cysts. There is also a heterogeneous structure seen towards the uterine fundus anteriorly measuring 18 x 15 x 17 mm and adjacent focus measuring 15 x 14 x 16 mm consistent with fibroids. Endometrium Thickness: Obscured by the fibroids in the overlapping soft tissue Right ovary Obscured by bowel gas Left ovary Obscured by bowel gas. Other findings No abnormal free fluid. IMPRESSION: Very limited examination. Poor visualization of the endometrium and ovaries. There are uterine fibroids. If there is further concern of the endometrium and adnexa additional cross-sectional study could be considered as clinically appropriate such as female pelvic MRI. Please correlate with patient's CT angiogram of the abdomen and pelvis from 09/22/2023. Electronically Signed   By: Karen Kays M.D.   On: 09/23/2023 10:25   CT Angio Abd/Pel W and/or Wo Contrast Result Date: 09/22/2023 CLINICAL DATA:  Mesenteric ischemia. Pain. History of vaginal prolapse EXAM: CTA ABDOMEN AND PELVIS WITHOUT AND WITH CONTRAST TECHNIQUE: Multidetector CT imaging of the abdomen and pelvis was performed using the standard protocol during bolus administration of intravenous contrast. Multiplanar reconstructed images and MIPs were obtained and reviewed to evaluate the vascular anatomy. RADIATION DOSE REDUCTION: This exam was performed according to the departmental dose-optimization program which includes automated exposure control, adjustment of the mA and/or kV according to  patient size and/or use of iterative reconstruction technique. CONTRAST:  OMNIPAQUE IOHEXOL 350 MG/ML SOLN COMPARISON:  Standard CT 02/01/2017 FINDINGS: VASCULAR Aorta: Slight atherosclerotic changes along the aorta. No dissection or aneurysm formation. Celiac: Patent without evidence of aneurysm, dissection, vasculitis or significant stenosis. SMA: Patent without evidence of aneurysm, dissection, vasculitis or significant stenosis. Renals: Both renal arteries are patent without evidence of aneurysm, dissection, vasculitis, fibromuscular dysplasia or significant stenosis. IMA: Patent without evidence of aneurysm, dissection, vasculitis or significant stenosis. Inflow: Patent without evidence of aneurysm, dissection, vasculitis or significant stenosis. Proximal Outflow: Bilateral common femoral and visualized portions of the superficial and profunda femoral arteries are patent without evidence of aneurysm, dissection, vasculitis or significant stenosis. Veins: No obvious venous abnormality within the limitations of this arterial phase study. Review of the MIP images confirms the above findings. NON-VASCULAR Lower chest: There is some linear opacity seen along bases likely scar or atelectasis. No pleural effusion. Hepatobiliary: Fatty liver infiltration identified. Tiny low-attenuation lesion in segment 6 inferiorly, too small to completely characterize but likely a benign cystic lesion and no specific imaging follow up. Patent portal vein. Gallbladder is nondilated. Pancreas: Unremarkable. No pancreatic ductal dilatation or surrounding inflammatory changes. Spleen: Normal in size without focal abnormality. Adrenals/Urinary Tract: Adrenal glands are preserved. Multiple bilateral parapelvic renal cysts. Mild bilateral renal atrophy. No collecting system dilatation. The ureters have normal course and caliber extending down to the relatively underdistended urinary bladder. Stomach/Bowel: On this non oral contrast  exam, the large bowel has a normal course and caliber. Diffuse colonic diverticulosis. Scattered stool. Normal appendix. Stomach is contracted. The small bowel overall is nondilated. There is  a segment of small bowel stool appearance in the anterior central pelvis. No areas of pneumatosis, wall thickening or adjacent inflammatory changes. Lymphatic: Aortic atherosclerosis. No enlarged abdominal or pelvic lymph nodes. Reproductive: Heterogeneous uterus. Possible fibroids. No adnexal mass. Again changes suggestive of some prolapse of fat along the pelvic floor. Other: No free air or free fluid. Small fat containing umbilical hernia. Musculoskeletal: Scattered degenerative changes of the spine and pelvis. IMPRESSION: VASCULAR Mild atherosclerotic changes. No dissection or aneurysm formation. Central mesenteric vessels are grossly patent. NON-VASCULAR Colonic diverticulosis. No obstruction, free air or free fluid. There is also small segment of small bowel stool appearance in the anterior pelvis without dilatation, inflammatory changes. Bilateral mild renal atrophy with multiple parapelvic renal cysts. Fatty liver infiltration. Electronically Signed   By: Karen Kays M.D.   On: 09/22/2023 13:40

## 2023-09-23 NOTE — ED Notes (Signed)
PT taken to US

## 2023-09-23 NOTE — ED Notes (Signed)
PT back in room with daughter at bedside. Pt in significant pain still. Crying stating "they're gonna tell me nothing is wrong and send me home." Pt also asking to be "knocked out"

## 2023-09-23 NOTE — ED Provider Notes (Signed)
[place] EMERGENCY DEPARTMENT AT [facility] Provider Note   CSN: [id number] Arrival date & time: 09/23/23  7322     History  Chief Complaint  Patient presents with   Abdominal Pain    Mariah King is a 83 y.o. female history of uterine fibroids presented for abdominal pain has been present for the past 2 days.  Patient was seen at [facility] long last night had dissection study along with labs drawn that was ultimately unremarkable. Patient does have history of fibroids and was told to follow-up with GYN. Patient is taking 2 Percocets since being discharged last night and states this has not helped. Patient states she still urinate but denies any chest pain shortness of breath or vomiting.   Home Medications Prior to Admission medications   Medication Sig Start Date End Date Taking? Authorizing Provider  acetaminophen (TYLENOL) 325 MG tablet Take 650 mg by mouth every 6 (six) hours as needed for mild pain or headache.   Yes [provider]  BREYNA 160-4.5 MCG/ACT inhaler Inhale 1 puff into the lungs 2 (two) times daily. 08/09/23  Yes [provider]  colchicine 0.6 MG tablet TAKE 1 TABLET BY MOUTH TWICE DAILY FOR 3 DAYS AND 1 ONCE DAILY FOR UP TO A WEEK AS NEEDED FOR GOUT 08/03/23  Yes [provider]  FLUoxetine (PROZAC) 40 MG capsule Take 40 mg by mouth every morning.    Yes [provider]  metFORMIN (GLUCOPHAGE-XR) 500 MG 24 hr tablet Take 500 mg by mouth daily. 06/22/23  Yes [provider]  naproxen (NAPROSYN) 375 MG tablet Take 1 tablet (375 mg total) by mouth 2 (two) times daily with a meal. 09/22/23  Yes Cheron Schaumann K, PA-C  oxyCODONE-acetaminophen (PERCOCET) 5-325 MG tablet Take 1 tablet by mouth every 4 (four) hours as needed for up to 5 days for severe pain (pain score 7-10). 09/22/23 09/27/23 Yes Elson Areas, PA-C  Vitamin D, Ergocalciferol, (DRISDOL) 1.25 MG (50000 UNIT) CAPS capsule Take 50,000 Units by mouth once a  week. 07/06/19  Yes [provider]  ADVAIR DISKUS 250-50 MCG/DOSE AEPB Inhale 1 puff into the lungs daily. Patient not taking: Reported on 09/23/2023 09/06/19   [provider]  LORazepam (ATIVAN) 0.5 MG tablet Take 0.5 mg by mouth every morning.  Patient not taking: Reported on 09/23/2023    [provider]  mirtazapine (REMERON) 15 MG tablet Take 15 mg by mouth at bedtime. Patient not taking: Reported on 09/23/2023    [provider]  mometasone-formoterol (DULERA) 100-5 MCG/ACT AERO Inhale 2 puffs into the lungs 2 (two) times daily. Patient not taking: Reported on 09/23/2023 06/22/18   Nyoka Cowden, MD  montelukast (SINGULAIR) 10 MG tablet Take 1 tablet (10 mg total) by mouth every morning. Patient not taking: Reported on 09/23/2023 06/22/18   Nyoka Cowden, MD  ondansetron (ZOFRAN) 4 MG tablet Take 4 mg by mouth once. A DAY FOR 20 DAYS AS NEEDED FOR NAUSEA Patient not taking: Reported on 09/23/2023 09/23/23   [provider]  pantoprazole (PROTONIX) 40 MG tablet Take 30- 60 min before your first and last meals of the day Patient not taking: Reported on 09/23/2023 06/01/18   Nyoka Cowden, MD      Allergies    Sulfa antibiotics    Review of Systems   Review of Systems  Gastrointestinal:  Positive for abdominal pain.    Physical Exam Updated Vital Signs BP (!) 145/63  Pulse 72   Temp 97.6 F (36.4 C)   Resp 17   Ht 5\' 4"  (1.626 m)   Wt 63 kg   SpO2 97%   BMI 23.84 kg/m  Physical Exam Constitutional:      General: She is in acute distress.  Cardiovascular:     Rate and Rhythm: Normal rate and regular rhythm.     Pulses: Normal pulses.     Heart sounds: Normal heart sounds.  Pulmonary:     Effort: Pulmonary effort is normal. No respiratory distress.     Breath sounds: Normal breath sounds.  Abdominal:     General: There is no distension.     Palpations: Abdomen is soft.     Tenderness: There is no abdominal tenderness. There  is no guarding or rebound. Negative signs include Murphy's sign, Rovsing's sign, McBurney's sign and psoas sign.     Comments: Patient states that her pain is relieved when you press on her suprapubic region but otherwise has no tenderness or peritoneal signs on exam  Neurological:     Mental Status: She is alert.     ED Results / Procedures / Treatments   Labs (all labs ordered are listed, but only abnormal results are displayed) Labs Reviewed  CBC WITH DIFFERENTIAL/PLATELET - Abnormal; Notable for the following components:      Result Value   WBC 14.5 (*)    Platelets 502 (*)    Neutro Abs 9.0 (*)    Lymphs Abs 4.1 (*)    Abs Immature Granulocytes 0.09 (*)    All other components within normal limits  COMPREHENSIVE METABOLIC PANEL - Abnormal; Notable for the following components:   Sodium 134 (*)    CO2 18 (*)    Glucose, Bld 144 (*)    Creatinine, Ser 1.32 (*)    Calcium 8.6 (*)    Albumin 3.2 (*)    GFR, Estimated 40 (*)    Anion gap 16 (*)    All other components within normal limits  I-STAT CG4 LACTIC ACID, ED - Abnormal; Notable for the following components:   Lactic Acid, Venous 3.0 (*)    All other components within normal limits  I-STAT CG4 LACTIC ACID, ED - Abnormal; Notable for the following components:   Lactic Acid, Venous 2.1 (*)    All other components within normal limits  CULTURE, BLOOD (ROUTINE X 2)  CULTURE, BLOOD (ROUTINE X 2)  LIPASE, BLOOD  URINALYSIS, ROUTINE W REFLEX MICROSCOPIC    EKG None  Radiology US Pelvis Complete Result Date: 09/23/2023 CLINICAL DATA:  Pelvic pain EXAM: TRANSABDOMINAL AND TRANSVAGINAL ULTRASOUND OF PELVIS TECHNIQUE: Both transabdominal and transvaginal ultrasound examinations of the pelvis were performed. Transabdominal technique was performed for global imaging of the pelvis including uterus, ovaries, adnexal regions, and pelvic cul-de-sac. It was necessary to proceed with endovaginal exam following the transabdominal  exam to visualize the endometrium and adnexa. COMPARISON:  None Available. FINDINGS: Uterus Measurements: 5.5 x 3.3 x 3.9 cm = volume: 37.8 mL. Multiple complex nabothian cysts. There is also a heterogeneous structure seen towards the uterine fundus anteriorly measuring 18 x 15 x 17 mm and adjacent focus measuring 15 x 14 x 16 mm consistent with fibroids. Endometrium Thickness: Obscured by the fibroids in the overlapping soft tissue Right ovary Obscured by bowel gas Left ovary Obscured by bowel gas. Other findings No abnormal free fluid. IMPRESSION: Very limited examination. Poor visualization of the endometrium and ovaries. There are uterine fibroids. If there is  further concern of the endometrium and adnexa additional cross-sectional study could be considered as clinically appropriate such as female pelvic MRI. Please correlate with patient's CT angiogram of the abdomen and pelvis from 09/22/2023. Electronically Signed   By: Karen Kays M.D.   On: 09/23/2023 10:25   US Transvaginal Non-OB Result Date: 09/23/2023 CLINICAL DATA:  Pelvic pain EXAM: TRANSABDOMINAL AND TRANSVAGINAL ULTRASOUND OF PELVIS TECHNIQUE: Both transabdominal and transvaginal ultrasound examinations of the pelvis were performed. Transabdominal technique was performed for global imaging of the pelvis including uterus, ovaries, adnexal regions, and pelvic cul-de-sac. It was necessary to proceed with endovaginal exam following the transabdominal exam to visualize the endometrium and adnexa. COMPARISON:  None Available. FINDINGS: Uterus Measurements: 5.5 x 3.3 x 3.9 cm = volume: 37.8 mL. Multiple complex nabothian cysts. There is also a heterogeneous structure seen towards the uterine fundus anteriorly measuring 18 x 15 x 17 mm and adjacent focus measuring 15 x 14 x 16 mm consistent with fibroids. Endometrium Thickness: Obscured by the fibroids in the overlapping soft tissue Right ovary Obscured by bowel gas Left ovary Obscured by bowel gas.  Other findings No abnormal free fluid. IMPRESSION: Very limited examination. Poor visualization of the endometrium and ovaries. There are uterine fibroids. If there is further concern of the endometrium and adnexa additional cross-sectional study could be considered as clinically appropriate such as female pelvic MRI. Please correlate with patient's CT angiogram of the abdomen and pelvis from 09/22/2023. Electronically Signed   By: Karen Kays M.D.   On: 09/23/2023 10:25   CT Angio Abd/Pel W and/or Wo Contrast Result Date: 09/22/2023 CLINICAL DATA:  Mesenteric ischemia. Pain. History of vaginal prolapse EXAM: CTA ABDOMEN AND PELVIS WITHOUT AND WITH CONTRAST TECHNIQUE: Multidetector CT imaging of the abdomen and pelvis was performed using the standard protocol during bolus administration of intravenous contrast. Multiplanar reconstructed images and MIPs were obtained and reviewed to evaluate the vascular anatomy. RADIATION DOSE REDUCTION: This exam was performed according to the departmental dose-optimization program which includes automated exposure control, adjustment of the mA and/or kV according to patient size and/or use of iterative reconstruction technique. CONTRAST:  OMNIPAQUE IOHEXOL 350 MG/ML SOLN COMPARISON:  Standard CT 02/01/2017 FINDINGS: VASCULAR Aorta: Slight atherosclerotic changes along the aorta. No dissection or aneurysm formation. Celiac: Patent without evidence of aneurysm, dissection, vasculitis or significant stenosis. SMA: Patent without evidence of aneurysm, dissection, vasculitis or significant stenosis. Renals: Both renal arteries are patent without evidence of aneurysm, dissection, vasculitis, fibromuscular dysplasia or significant stenosis. IMA: Patent without evidence of aneurysm, dissection, vasculitis or significant stenosis. Inflow: Patent without evidence of aneurysm, dissection, vasculitis or significant stenosis. Proximal Outflow: Bilateral common femoral and visualized  portions of the superficial and profunda femoral arteries are patent without evidence of aneurysm, dissection, vasculitis or significant stenosis. Veins: No obvious venous abnormality within the limitations of this arterial phase study. Review of the MIP images confirms the above findings. NON-VASCULAR Lower chest: There is some linear opacity seen along bases likely scar or atelectasis. No pleural effusion. Hepatobiliary: Fatty liver infiltration identified. Tiny low-attenuation lesion in segment 6 inferiorly, too small to completely characterize but likely a benign cystic lesion and no specific imaging follow up. Patent portal vein. Gallbladder is nondilated. Pancreas: Unremarkable. No pancreatic ductal dilatation or surrounding inflammatory changes. Spleen: Normal in size without focal abnormality. Adrenals/Urinary Tract: Adrenal glands are preserved. Multiple bilateral parapelvic renal cysts. Mild bilateral renal atrophy. No collecting system dilatation. The ureters have normal course and caliber extending down  to the relatively underdistended urinary bladder. Stomach/Bowel: On this non oral contrast exam, the large bowel has a normal course and caliber. Diffuse colonic diverticulosis. Scattered stool. Normal appendix. Stomach is contracted. The small bowel overall is nondilated. There is a segment of small bowel stool appearance in the anterior central pelvis. No areas of pneumatosis, wall thickening or adjacent inflammatory changes. Lymphatic: Aortic atherosclerosis. No enlarged abdominal or pelvic lymph nodes. Reproductive: Heterogeneous uterus. Possible fibroids. No adnexal mass. Again changes suggestive of some prolapse of fat along the pelvic floor. Other: No free air or free fluid. Small fat containing umbilical hernia. Musculoskeletal: Scattered degenerative changes of the spine and pelvis. IMPRESSION: VASCULAR Mild atherosclerotic changes. No dissection or aneurysm formation. Central mesenteric vessels  are grossly patent. NON-VASCULAR Colonic diverticulosis. No obstruction, free air or free fluid. There is also small segment of small bowel stool appearance in the anterior pelvis without dilatation, inflammatory changes. Bilateral mild renal atrophy with multiple parapelvic renal cysts. Fatty liver infiltration. Electronically Signed   By: Karen Kays M.D.   On: 09/22/2023 13:40    Procedures .Critical Care  Performed by: Netta Corrigan, PA-C Authorized by: Netta Corrigan, PA-C   Critical care provider statement:    Critical care time (minutes):  50   Critical care was necessary to treat or prevent imminent or life-threatening deterioration of the following conditions:  Dehydration   Critical care was time spent personally by me on the following activities:  Blood draw for specimens, development of treatment plan with patient or surrogate, discussions with consultants, evaluation of patient's response to treatment, examination of patient, obtaining history from patient or surrogate, review of old charts, re-evaluation of patient's condition, pulse oximetry, ordering and review of radiographic studies, ordering and review of laboratory studies and ordering and performing treatments and interventions   I assumed direction of critical care for this patient from another provider in my specialty: no     Care discussed with: admitting provider       Medications Ordered in ED Medications  morphine (PF) 2 MG/ML injection 4 mg (4 mg Intravenous Given 09/23/23 0812)  lactated ringers bolus 500 mL (0 mLs Intravenous Stopped 09/23/23 1008)  HYDROmorphone (DILAUDID) injection 0.5 mg (0.5 mg Intravenous Given 09/23/23 0850)  HYDROmorphone (DILAUDID) injection 0.5 mg (0.5 mg Intravenous Given 09/23/23 0919)    ED Course/ Medical Decision Making/ A&P                                 Medical Decision Making Amount and/or Complexity of Data Reviewed Labs: ordered. Radiology:  ordered.  Risk Prescription drug management.   [name] 83 y.o. presented today for abdominal pain.  Working DDx that I considered at this time includes, but not limited to, gastroenteritis, colitis, small bowel obstruction, appendicitis, cholecystitis, hepatobiliary pathology, gastritis, PUD, ACS, aortic dissection, diverticulosis/diverticulitis, pancreatitis, nephrolithiasis, medication induced, AAA, UTI, pyelonephritis, ruptured ectopic pregnancy, PID, ovarian torsion.  R/o DDx: gastroenteritis, colitis, small bowel obstruction, appendicitis, cholecystitis, hepatobiliary pathology, gastritis, PUD, ACS, aortic dissection, diverticulosis/diverticulitis, pancreatitis, nephrolithiasis, medication induced, AAA, UTI, pyelonephritis, ruptured ectopic pregnancy, PID, ovarian torsion: These are considered less likely due to history of present illness, physical exam, labs/imaging findings.  Review of prior external notes: 09/22/2023 ED  Unique Tests and My Interpretation:  CBC with differential: Leukocytosis 14.5 Lactic acid: 3.0, 2.1 CMP: NA 1.32, anion gap 16 most likely from lactic acidosis, GFR 40 Lipase: Unremarkable UA:  Pending Ultrasound: Uterine fibroids however no acute pathology  Social Determinants of Health: none  Discussion with Independent Historian:  Daughter  Discussion of Management of Tests:  Milas Hock, MD GYN ; Arlyss Queen, MD Hospitalist  Risk: High: hospitalization or escalation of hospital-level care  Risk Stratification Score: None  Staffed with Belfi, MD   Plan: On exam patient was in acute distress with stable vitals.  This patient was evaluated by this provider in triage and labs in triage do show elevated lactic acid so we will give fluids.  On exam patient states her pain is somewhat relieved when pressing on the suprapubic region however there does not appear to be any peritoneal signs or tenderness.  Patient does have rising white count from yesterday  from 13.9-14.5.  Patient states pain was not controlled by the Percocets and is not controlled by the morphine given will give Dilaudid.  Still waiting on the rest of labs and imaging.  Patient did have CTA dissection study done yesterday and so do not feel we need to repeat CT imaging for dissection or mesenteric ischemia.  Do anticipate reaching out to OB/GYN considering patient's repeat visit after we get the ultrasound.  If pain is not controlled will admit to medicine for pain management as we have tried outpatient therapy and this is failed.  I spoke to Thompson Caul, MD Radiology to look at the CTA done yesterday given elevated lactic acid and pain out of proportion on exam and he does confirm that the study was good and that the vessels do appear patent and nonocclusive with minimal aortic atherosclerosis.  He did not see any acute abnormalities in the uterus outside of possible fibroids as reported previously.  Patient still having pain after receiving the 0.5 mg of Dilaudid and so we will give another round before ultrasound for patient's comfort.  Attending did do a bimanual and states that there is minimal vaginal prolapse but no signs of necrosis.  Labs do show the patient does have mild AKI which may explain lactic acid.  Patient is currently receiving fluids.  Repeat lactic acid shows that lactic acid is improving with fluids.  Ultrasound tech notified me that they were unable to visualize the ovaries and cannot do the Doppler with the ultrasound.  Ultrasound does not show any acute pathology.  I spoke with GYN on-call and they state they will come down to see the patient and be available for consult once admitted however they do not think this is a GYN pathology and do recommend blood cultures as this could be infectious so these were ordered.  Hospitalist consulted.  Patient stable to be admitted.  I spoke to the hospitalist and patient was accepted for admission.  Patient stable for  admission.  This chart was dictated using voice recognition software.  Despite best efforts to proofread,  errors can occur which can change the documentation meaning.         Final Clinical Impression(s) / ED Diagnoses Final diagnoses:  AKI (acute kidney injury) (HCC)  Pelvic pain    Rx / DC Orders ED Discharge Orders     None         Remi Deter 09/23/23 1120    Rolan Bucco, MD 09/23/23 1329

## 2023-09-24 DIAGNOSIS — R1030 Lower abdominal pain, unspecified: Secondary | ICD-10-CM | POA: Diagnosis not present

## 2023-09-24 DIAGNOSIS — G4700 Insomnia, unspecified: Secondary | ICD-10-CM | POA: Diagnosis present

## 2023-09-24 DIAGNOSIS — E8721 Acute metabolic acidosis: Secondary | ICD-10-CM | POA: Diagnosis present

## 2023-09-24 DIAGNOSIS — N179 Acute kidney failure, unspecified: Secondary | ICD-10-CM | POA: Diagnosis present

## 2023-09-24 DIAGNOSIS — D75839 Thrombocytosis, unspecified: Secondary | ICD-10-CM | POA: Diagnosis present

## 2023-09-24 DIAGNOSIS — Z882 Allergy status to sulfonamides status: Secondary | ICD-10-CM | POA: Diagnosis not present

## 2023-09-24 DIAGNOSIS — Z79899 Other long term (current) drug therapy: Secondary | ICD-10-CM | POA: Diagnosis not present

## 2023-09-24 DIAGNOSIS — Z7951 Long term (current) use of inhaled steroids: Secondary | ICD-10-CM | POA: Diagnosis not present

## 2023-09-24 DIAGNOSIS — E119 Type 2 diabetes mellitus without complications: Secondary | ICD-10-CM | POA: Diagnosis present

## 2023-09-24 DIAGNOSIS — D259 Leiomyoma of uterus, unspecified: Secondary | ICD-10-CM | POA: Diagnosis present

## 2023-09-24 DIAGNOSIS — J45909 Unspecified asthma, uncomplicated: Secondary | ICD-10-CM | POA: Diagnosis present

## 2023-09-24 DIAGNOSIS — N390 Urinary tract infection, site not specified: Secondary | ICD-10-CM | POA: Diagnosis present

## 2023-09-24 DIAGNOSIS — Z7984 Long term (current) use of oral hypoglycemic drugs: Secondary | ICD-10-CM | POA: Diagnosis not present

## 2023-09-24 DIAGNOSIS — F32A Depression, unspecified: Secondary | ICD-10-CM | POA: Diagnosis present

## 2023-09-24 DIAGNOSIS — E871 Hypo-osmolality and hyponatremia: Secondary | ICD-10-CM | POA: Diagnosis present

## 2023-09-24 DIAGNOSIS — I700 Atherosclerosis of aorta: Secondary | ICD-10-CM | POA: Diagnosis present

## 2023-09-24 DIAGNOSIS — Z87891 Personal history of nicotine dependence: Secondary | ICD-10-CM | POA: Diagnosis not present

## 2023-09-24 DIAGNOSIS — D72829 Elevated white blood cell count, unspecified: Secondary | ICD-10-CM | POA: Diagnosis present

## 2023-09-24 LAB — COMPREHENSIVE METABOLIC PANEL
ALT: 14 U/L (ref 0–44)
AST: 24 U/L (ref 15–41)
Albumin: 2.8 g/dL — ABNORMAL LOW (ref 3.5–5.0)
Alkaline Phosphatase: 71 U/L (ref 38–126)
Anion gap: 9 (ref 5–15)
BUN: 14 mg/dL (ref 8–23)
CO2: 26 mmol/L (ref 22–32)
Calcium: 8.3 mg/dL — ABNORMAL LOW (ref 8.9–10.3)
Chloride: 104 mmol/L (ref 98–111)
Creatinine, Ser: 1.05 mg/dL — ABNORMAL HIGH (ref 0.44–1.00)
GFR, Estimated: 53 mL/min — ABNORMAL LOW (ref >60)
Glucose, Bld: 159 mg/dL — ABNORMAL HIGH (ref 70–99)
Potassium: 3.6 mmol/L (ref 3.5–5.1)
Sodium: 139 mmol/L (ref 135–145)
Total Bilirubin: 0.6 mg/dL (ref 0.0–1.2)
Total Protein: 6 g/dL — ABNORMAL LOW (ref 6.5–8.1)

## 2023-09-24 LAB — CBC
HCT: 38 % (ref 36.0–46.0)
Hemoglobin: 12.3 g/dL (ref 12.0–15.0)
MCH: 28.7 pg (ref 26.0–34.0)
MCHC: 32.4 g/dL (ref 30.0–36.0)
MCV: 88.8 fL (ref 80.0–100.0)
Platelets: 297 10*3/uL (ref 150–400)
RBC: 4.28 MIL/uL (ref 3.87–5.11)
RDW: 14.7 % (ref 11.5–15.5)
WBC: 10.9 10*3/uL — ABNORMAL HIGH (ref 4.0–10.5)
nRBC: 0 % (ref 0.0–0.2)

## 2023-09-24 MED ORDER — POLYETHYLENE GLYCOL 3350 17 G PO PACK: 17.0000 g | PACK | Freq: Every day | ORAL | Status: DC | PRN

## 2023-09-24 MED ORDER — ONDANSETRON HCL 4 MG/2ML IJ SOLN
4.0000 mg | Freq: Four times a day (QID) | INTRAMUSCULAR | Status: DC | PRN
  Administered 2023-09-24: 4 mg via INTRAVENOUS
  Filled 2023-09-24: qty 2

## 2023-09-24 MED ORDER — MIRTAZAPINE 15 MG PO TABS
15.0000 mg | ORAL_TABLET | Freq: Every evening | ORAL | Status: DC | PRN
  Administered 2023-09-24 – 2023-09-25 (×2): 15 mg via ORAL
  Filled 2023-09-24 (×2): qty 1

## 2023-09-24 MED ORDER — SENNOSIDES-DOCUSATE SODIUM 8.6-50 MG PO TABS
1.0000 | ORAL_TABLET | Freq: Two times a day (BID) | ORAL | Status: DC
  Administered 2023-09-24 – 2023-09-26 (×4): 1 via ORAL
  Filled 2023-09-24 (×5): qty 1

## 2023-09-24 MED ORDER — ALUM & MAG HYDROXIDE-SIMETH 200-200-20 MG/5ML PO SUSP: 15.0000 mL | ORAL | Status: DC | PRN

## 2023-09-24 MED ORDER — BISACODYL 10 MG RE SUPP: 10.0000 mg | Freq: Every day | RECTAL | Status: DC | PRN

## 2023-09-24 MED ORDER — PANTOPRAZOLE SODIUM 40 MG IV SOLR
40.0000 mg | INTRAVENOUS | Status: DC
  Administered 2023-09-24 – 2023-09-25 (×2): 40 mg via INTRAVENOUS
  Filled 2023-09-24 (×2): qty 10

## 2023-09-24 NOTE — TOC CM/SW Note (Signed)
Transition of Care [facility]) - Inpatient Brief Assessment   Patient Details  Name: Mariah King MRN: [id number] Date of Birth: [birth year]  [facility] CM/SW Contact:    Gala Lewandowsky, RN Phone Number: 09/24/2023, 12:19 PM   Clinical Narrative: Patient presented for abdominal pain. PTA patient was from home alone and has support of daughter that lives in [place]. Daughter [name] at the bedside during the visit and she states she will be staying with her mom some when she discharges. Patient does not use any DME in the home and per daughter patient still drives to her appointments. Case Manager will continue to follow for additional transition of care needs as she progresses.    Transition of Care Asessment: Insurance and Status: Insurance coverage has been reviewed Patient has primary care physician: Yes Home environment has been reviewed: reviewed Prior level of function:: independent Prior/Current Home Services: No current home services Social Drivers of Health Review: SDOH reviewed no interventions necessary Readmission risk has been reviewed: Yes Transition of care needs: transition of care needs identified, TOC will continue to follow

## 2023-09-24 NOTE — Progress Notes (Signed)
TRH night cross cover note:   I was notified by RN of the patient's request for resumption of home Remeron for her insomnia.  I subsequently resumed home Remeron, but on a prn basis.     Newton Pigg, DO Hospitalist

## 2023-09-24 NOTE — Evaluation (Signed)
Physical Therapy Evaluation Patient Details Name: Mariah King MRN: [id number] DOB: February 05, 1941 Today's Date: 09/24/2023  History of Present Illness  Pt is 83 yo female who presents on 09/22/23 with severe lower abdominal and pelvic pain. Pt had vaginal prolapse with reduction at PCP PTA.  PMH: depression, anxiety, asthma  Clinical Impression  Pt admitted with above diagnosis. Pt from home alone, daughter lives in [place] and is here now and is going to stay with her for several days at d/c. Pt independent at baseline, drives, shops, performs self care, ambulates without AD. Denies falls. Pt mobilizing in room at supervision level. In hallway pt reaching for rail and given min HHA without rail, mostly due to tremor that seems anxiety related. Pt very shaky throughout mobility. Discussed RW to take home but pt and daughter defer at this time. Will follow acutely but do not anticipate pt needing PT at d/c.  Pt currently with functional limitations due to the deficits listed below (see PT Problem List). Pt will benefit from acute skilled PT to increase their independence and safety with mobility to allow discharge.           If plan is discharge home, recommend the following: Assistance with cooking/housework;Assist for transportation   Can travel by private vehicle        Equipment Recommendations None recommended by PT  Recommendations for Other Services       Functional Status Assessment Patient has had a recent decline in their functional status and demonstrates the ability to make significant improvements in function in a reasonable and predictable amount of time.     Precautions / Restrictions Precautions Precautions: Fall Restrictions Weight Bearing Restrictions Per Provider Order: No      Mobility  Bed Mobility Overal bed mobility: Modified Independent             General bed mobility comments: supine <> sit without difficulty    Transfers Overall transfer level:  Needs assistance Equipment used: None Transfers: Sit to/from Stand, Bed to chair/wheelchair/BSC Sit to Stand: Supervision   Step pivot transfers: Supervision       General transfer comment: supervision for safety, pt mildly unsteady    Ambulation/Gait Ambulation/Gait assistance: Contact guard assist, Min assist Gait Distance (Feet): 80 Feet Assistive device: None, 1 person hand held assist Gait Pattern/deviations: Step-through pattern Gait velocity: decreased Gait velocity interpretation: 1.31 - 2.62 ft/sec, indicative of limited community ambulator   General Gait Details: CGA when pt holding rail, min HHA without rail. Balance limited by pt tremor which she  reports is not present often at home. DIscussed taking RW home but pt and daughter defer at this point  Stairs            Wheelchair Mobility     Tilt Bed    Modified Rankin (Stroke Patients Only)       Balance Overall balance assessment: Mild deficits observed, not formally tested                                           Pertinent Vitals/Pain Pain Assessment Pain Assessment: No/denies pain    Home Living Family/patient expects to be discharged to:: Private residence Living Arrangements: Alone Available Help at Discharge: Family;Available PRN/intermittently Type of Home: House Home Access: Ramped entrance       Home Layout: One level Home Equipment: None Additional Comments: daughter lives in  [place] but plans to stay with her awhile when she goes home    Prior Function Prior Level of Function : Independent/Modified Independent;Driving             Mobility Comments: ambulates without AD ADLs Comments: independent     Extremity/Trunk Assessment   Upper Extremity Assessment Upper Extremity Assessment: Overall WFL for tasks assessed (BUE tremors noted intermittently)    Lower Extremity Assessment Lower Extremity Assessment: Overall WFL for tasks assessed     Cervical / Trunk Assessment Cervical / Trunk Assessment: Kyphotic  Communication   Communication Communication: No apparent difficulties Cueing Techniques: Verbal cues  Cognition Arousal: Alert Behavior During Therapy: Anxious (very anxious) Overall Cognitive Status: Within Functional Limits for tasks assessed                                 General Comments: shaking all over with anxiety but denies abdominal pain and nausea        General Comments General comments (skin integrity, edema, etc.): SPO2 96% on RA, HR 87 bpm    Exercises     Assessment/Plan    PT Assessment Patient needs continued PT services  PT Problem List Decreased mobility;Other (comment) (anxiety)       PT Treatment Interventions DME instruction;Gait training;Functional mobility training;Therapeutic activities;Therapeutic exercise;Balance training;Patient/family education    PT Goals (Current goals can be found in the Care Plan section)  Acute Rehab PT Goals Patient Stated Goal: return home PT Goal Formulation: With patient/family Time For Goal Achievement: 10/08/23 Potential to Achieve Goals: Good    Frequency Min 1X/week     Co-evaluation               AM-PAC PT "6 Clicks" Mobility  Outcome Measure Help needed turning from your back to your side while in a flat bed without using bedrails?: None Help needed moving from lying on your back to sitting on the side of a flat bed without using bedrails?: None Help needed moving to and from a bed to a chair (including a wheelchair)?: None Help needed standing up from a chair using your arms (e.g., wheelchair or bedside chair)?: None Help needed to walk in hospital room?: A Little Help needed climbing 3-5 steps with a railing? : A Little 6 Click Score: 22    End of Session   Activity Tolerance: Patient tolerated treatment well Patient left: in chair;with call bell/phone within reach;with family/visitor present Nurse  Communication: Mobility status PT Visit Diagnosis: Unsteadiness on feet (R26.81)    Time: 9604-5409 PT Time Calculation (min) (ACUTE ONLY): 23 min   Charges:   PT Evaluation $PT Eval Moderate Complexity: 1 Mod PT Treatments $Gait Training: 8-22 mins PT General Charges $$ ACUTE PT VISIT: 1 Visit         Lyanne Co, PT  Acute Rehab Services Secure chat preferred Office [phone]   Lawana Chambers Deztinee Lohmeyer 09/24/2023, 12:02 PM

## 2023-09-24 NOTE — Evaluation (Signed)
Occupational Therapy Evaluation and DC Summary  Patient Details Name: Mariah King MRN: [id number] DOB: Apr 07, 1941 Today's Date: 09/24/2023   History of Present Illness Pt is 83 yo female who presents on 09/22/23 with severe lower abdominal and pelvic pain. Pt had vaginal prolapse with reduction at PCP PTA.  PMH: depression, anxiety, asthma   Clinical Impression   Pt admitted for above, she presents close to functional baseline and completes ADLs at a supervision to independent level. Pt scored WNL on the 30 sec STS assessment for her age range, placing her within the low falls risk category per the assessment. She demonstrated how she performs some of her routine with a similar setup to her home environment. Pt has no further acute skilled OT needs, no post acute OT recommended.        If plan is discharge home, recommend the following: Other (comment) (prn)    Functional Status Assessment  Patient has not had a recent decline in their functional status  Equipment Recommendations  None recommended by OT (pt has rec DME)    Recommendations for Other Services       Precautions / Restrictions Precautions Precautions: Fall Restrictions Weight Bearing Restrictions Per Provider Order: No      Mobility Bed Mobility Overal bed mobility: Modified Independent                  Transfers Overall transfer level: Needs assistance Equipment used: None Transfers: Sit to/from Stand Sit to Stand: Supervision                  Balance Overall balance assessment: Mild deficits observed, not formally tested                                         ADL either performed or assessed with clinical judgement   ADL Overall ADL's : Needs assistance/impaired;At baseline Eating/Feeding: Independent;Sitting   Grooming: Standing;Supervision/safety   Upper Body Bathing: Standing;Supervision/ safety Upper Body Bathing Details (indicate cue type and reason):  simulated Lower Body Bathing: Supervison/ safety;Sit to/from stand Lower Body Bathing Details (indicate cue type and reason): simulated with one UE supported on rail to mirror home setup Upper Body Dressing : Independent;Sitting   Lower Body Dressing: Sitting/lateral leans;Set up   Toilet Transfer: Supervision/safety             General ADL Comments: Administered 30 sec STS test to assess falls risk, Pt score 13 stands in a 30 sec time frame, which is above the score of 12 for her age range placing this pt as a low fall risk based on her functional assessment score guide     Vision         Perception         Praxis         Pertinent Vitals/Pain Pain Assessment Pain Assessment: No/denies pain     Extremity/Trunk Assessment Upper Extremity Assessment Upper Extremity Assessment: Overall WFL for tasks assessed   Lower Extremity Assessment Lower Extremity Assessment: Overall WFL for tasks assessed   Cervical / Trunk Assessment Cervical / Trunk Assessment: Kyphotic   Communication Communication Communication: No apparent difficulties Cueing Techniques: Verbal cues   Cognition Arousal: Alert Behavior During Therapy: WFL for tasks assessed/performed Overall Cognitive Status: Within Functional Limits for tasks assessed  General Comments  Pt daughter present    Exercises     Shoulder Instructions      Home Living Family/patient expects to be discharged to:: Private residence Living Arrangements: Alone Available Help at Discharge: Family;Available PRN/intermittently Type of Home: House Home Access: Ramped entrance     Home Layout: One level     Bathroom Shower/Tub: Producer, television/film/video: Handicapped height (has sink counter to push off of)     Home Equipment: None;Grab bars - tub/shower   Additional Comments: daughter lives in [place] but plans to stay with her awhile when she goes  home      Prior Functioning/Environment Prior Level of Function : Independent/Modified Independent;Driving             Mobility Comments: ambulates without AD ADLs Comments: independent        OT Problem List: Pain      OT Treatment/Interventions:      OT Goals(Current goals can be found in the care plan section) Acute Rehab OT Goals Patient Stated Goal: To go home OT Goal Formulation: With patient Time For Goal Achievement: 10/08/23 Potential to Achieve Goals: Good  OT Frequency:      Co-evaluation              AM-PAC OT "6 Clicks" Daily Activity     Outcome Measure Help from another person eating meals?: None Help from another person taking care of personal grooming?: A Little Help from another person toileting, which includes using toliet, bedpan, or urinal?: A Little Help from another person bathing (including washing, rinsing, drying)?: A Little Help from another person to put on and taking off regular upper body clothing?: None Help from another person to put on and taking off regular lower body clothing?: A Little 6 Click Score: 20   End of Session Nurse Communication: Mobility status  Activity Tolerance: Patient tolerated treatment well Patient left: in bed;with call bell/phone within reach;with nursing/sitter in room;with family/visitor present  OT Visit Diagnosis: Pain Pain - Right/Left:  (pelvis- resolved)                Time: 7846-9629 OT Time Calculation (min): 15 min Charges:  OT General Charges $OT Visit: 1 Visit OT Evaluation $OT Eval Low Complexity: 1 Low  09/24/2023  AB, OTR/L  Acute Rehabilitation Services  Office: [phone]   Tristan Schroeder 09/24/2023, 5:49 PM

## 2023-09-24 NOTE — Care Management Obs Status (Signed)
MEDICARE OBSERVATION STATUS NOTIFICATION   Patient Details  Name: LANGSTON KELTER MRN: [id number] Date of Birth: 10/07/1940   Medicare Observation Status Notification Given:  Yes    Gala Lewandowsky, RN 09/24/2023, 11:56 AM

## 2023-09-24 NOTE — Progress Notes (Signed)
PROGRESS NOTE    MIREIA KOSEK  BJY:[id number] DOB: June 09, 1941 DOA: 09/23/2023 PCP: Garlan Fillers, MD   Brief Narrative:  83 y.o. female with medical history significant of asthma and depression, vaginal prolapse which was manually reduced in the office recently presented with severe lower abdominal and pelvic pain.  On presentation, WBC was 14.5, creatinine of 1.32, lactic acid was elevated.  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  UA was suggestive of UTI.  She was started on IV fluids, antibiotics and pain medications.  GYN was consulted.  Assessment & Plan:   Severe lower abdominal/pelvic pain Possible urinary tract infection: Present on admission -Recent CT angio of the abdomen and pelvis was negative for any acute abnormality.  Transvaginal ultrasound only showed uterine fibroids.  GYN consulted and and recommended that pain was probably not because of GYN issues: GYN has already signed off.  Outpatient follow-up with GYN if needed -Pain is improving. -Continue Rocephin.  Follow urine cultures.  Leukocytosis -Resolved  Thrombocytosis Resolved  Hyponatremia -Improved  Acute metabolic acidosis Lactic acidosis: Resolved -Resolved  Acute kidney injury -Improved.  Currently on IV fluids.  Encourage oral intake.  DC IV fluids.  Diabetes mellitus type 2 -Metformin on hold.  Carb modified diet  Depression -Continue Prozac  Asthma -Stable.  Continue Dulera  DVT prophylaxis: Lovenox Code Status: Full Family Communication: None at bedside Disposition Plan: Status is: Observation The patient will require care spanning > 2 midnights and should be moved to inpatient because: Of severity of illness.  Need for IV antibiotics    Consultants: GYN  Procedures: None  Antimicrobials: Rocephin from 09/23/2023 onwards   Subjective: Patient seen and examined at bedside.  No  abdominal pain is significantly improving.  No fever, vomiting, chest pain reported.  Objective: Vitals:   09/23/23 1726 09/23/23 1754 09/23/23 2133 09/24/23 0519  BP:  139/63 (!) 126/104 134/65  Pulse:  72 70 68  Resp:  17 18 18   Temp: 97.7 F (36.5 C) 97.6 F (36.4 C) 98.4 F (36.9 C) 98.4 F (36.9 C)  TempSrc: Oral Oral    SpO2:  93% 93% 93%  Weight:      Height:        Intake/Output Summary (Last 24 hours) at 09/24/2023 0929 Last data filed at 09/24/2023 0716 Gross per 24 hour  Intake 239.3 ml  Output 0 ml  Net 239.3 ml   Filed Weights   09/23/23 0656  Weight: 63 kg    Examination:  General exam: Appears calm and comfortable.  On room air.  Elderly female lying in bed. Respiratory system: Bilateral decreased breath sounds at bases Cardiovascular system: S1 & S2 heard, Rate controlled Gastrointestinal system: Abdomen is nondistended, soft and nontender. Normal bowel sounds heard. Extremities: No cyanosis, clubbing, edema  Central nervous system: Alert and oriented. No focal neurological deficits. Moving extremities Skin: No rashes, lesions or ulcers Psychiatry: Flat affect.  Not agitated.   Data Reviewed: I have personally reviewed following labs and imaging studies  CBC: Recent Labs  Lab 09/22/23 1028 09/23/23 0804 09/24/23 0315  WBC 13.9* 14.5* 10.9*  NEUTROABS 7.9* 9.0*  --   HGB 14.4 14.3 12.3  HCT 43.5 41.5 38.0  MCV 86.7 86.3 88.8  PLT 373 502* 297   Basic Metabolic Panel: Recent Labs  Lab 09/22/23 1028 09/23/23 0845 09/24/23 0315  NA 135 134* 139  K 4.6 3.8 3.6  CL 102  100 104  CO2 19* 18* 26  GLUCOSE 164* 144* 159*  BUN 18 21 14   CREATININE 0.87 1.32* 1.05*  CALCIUM 9.4 8.6* 8.3*   GFR: Estimated Creatinine Clearance: 35.7 mL/min (A) (by C-G formula based on SCr of 1.05 mg/dL (H)). Liver Function Tests: Recent Labs  Lab 09/22/23 1028 09/23/23 0845 09/24/23 0315  AST 24 23 24   ALT 13 13 14   ALKPHOS 87 78 71  BILITOT 0.6 0.5  0.6  PROT 7.9 6.6 6.0*  ALBUMIN 3.7 3.2* 2.8*   Recent Labs  Lab 09/23/23 0845  LIPASE 37   No results for input(s): "AMMONIA" in the last 168 hours. Coagulation Profile: No results for input(s): "INR", "PROTIME" in the last 168 hours. Cardiac Enzymes: No results for input(s): "CKTOTAL", "CKMB", "CKMBINDEX", "TROPONINI" in the last 168 hours. BNP (last 3 results) No results for input(s): "PROBNP" in the last 8760 hours. HbA1C: No results for input(s): "HGBA1C" in the last 72 hours. CBG: No results for input(s): "GLUCAP" in the last 168 hours. Lipid Profile: No results for input(s): "CHOL", "HDL", "LDLCALC", "TRIG", "CHOLHDL", "LDLDIRECT" in the last 72 hours. Thyroid Function Tests: No results for input(s): "TSH", "T4TOTAL", "FREET4", "T3FREE", "THYROIDAB" in the last 72 hours. Anemia Panel: No results for input(s): "VITAMINB12", "FOLATE", "FERRITIN", "TIBC", "IRON", "RETICCTPCT" in the last 72 hours. Sepsis Labs: Recent Labs  Lab 09/23/23 0815 09/23/23 1052 09/23/23 1823  LATICACIDVEN 3.0* 2.1* 1.0    Recent Results (from the past 240 hours)  Wet prep, genital     Status: Abnormal   Collection Time: 09/23/23  3:15 PM  Result Value Ref Range Status   Yeast Wet Prep HPF POC NONE SEEN NONE SEEN Final   Trich, Wet Prep NONE SEEN NONE SEEN Final   Clue Cells Wet Prep HPF POC NONE SEEN NONE SEEN Final   WBC, Wet Prep HPF POC >=10 (A) <10 Final   Sperm NONE SEEN  Final    Comment: Performed at [facility], [address], [place], [place] [address]         Radiology Studies: US Pelvis Complete Result Date: 09/23/2023 CLINICAL DATA:  Pelvic pain EXAM: TRANSABDOMINAL AND TRANSVAGINAL ULTRASOUND OF PELVIS TECHNIQUE: Both transabdominal and transvaginal ultrasound examinations of the pelvis were performed. Transabdominal technique was performed for global imaging of the pelvis including uterus, ovaries, adnexal regions, and pelvic cul-de-sac. It was necessary to  proceed with endovaginal exam following the transabdominal exam to visualize the endometrium and adnexa. COMPARISON:  None Available. FINDINGS: Uterus Measurements: 5.5 x 3.3 x 3.9 cm = volume: 37.8 mL. Multiple complex nabothian cysts. There is also a heterogeneous structure seen towards the uterine fundus anteriorly measuring 18 x 15 x 17 mm and adjacent focus measuring 15 x 14 x 16 mm consistent with fibroids. Endometrium Thickness: Obscured by the fibroids in the overlapping soft tissue Right ovary Obscured by bowel gas Left ovary Obscured by bowel gas. Other findings No abnormal free fluid. IMPRESSION: Very limited examination. Poor visualization of the endometrium and ovaries. There are uterine fibroids. If there is further concern of the endometrium and adnexa additional cross-sectional study could be considered as clinically appropriate such as female pelvic MRI. Please correlate with patient's CT angiogram of the abdomen and pelvis from 09/22/2023. Electronically Signed   By: Karen Kays M.D.   On: 09/23/2023 10:25   US Transvaginal Non-OB Result Date: 09/23/2023 CLINICAL DATA:  Pelvic pain EXAM: TRANSABDOMINAL AND TRANSVAGINAL ULTRASOUND OF PELVIS TECHNIQUE: Both transabdominal and transvaginal ultrasound  examinations of the pelvis were performed. Transabdominal technique was performed for global imaging of the pelvis including uterus, ovaries, adnexal regions, and pelvic cul-de-sac. It was necessary to proceed with endovaginal exam following the transabdominal exam to visualize the endometrium and adnexa. COMPARISON:  None Available. FINDINGS: Uterus Measurements: 5.5 x 3.3 x 3.9 cm = volume: 37.8 mL. Multiple complex nabothian cysts. There is also a heterogeneous structure seen towards the uterine fundus anteriorly measuring 18 x 15 x 17 mm and adjacent focus measuring 15 x 14 x 16 mm consistent with fibroids. Endometrium Thickness: Obscured by the fibroids in the overlapping soft tissue Right ovary  Obscured by bowel gas Left ovary Obscured by bowel gas. Other findings No abnormal free fluid. IMPRESSION: Very limited examination. Poor visualization of the endometrium and ovaries. There are uterine fibroids. If there is further concern of the endometrium and adnexa additional cross-sectional study could be considered as clinically appropriate such as female pelvic MRI. Please correlate with patient's CT angiogram of the abdomen and pelvis from 09/22/2023. Electronically Signed   By: Karen Kays M.D.   On: 09/23/2023 10:25   CT Angio Abd/Pel W and/or Wo Contrast Result Date: 09/22/2023 CLINICAL DATA:  Mesenteric ischemia. Pain. History of vaginal prolapse EXAM: CTA ABDOMEN AND PELVIS WITHOUT AND WITH CONTRAST TECHNIQUE: Multidetector CT imaging of the abdomen and pelvis was performed using the standard protocol during bolus administration of intravenous contrast. Multiplanar reconstructed images and MIPs were obtained and reviewed to evaluate the vascular anatomy. RADIATION DOSE REDUCTION: This exam was performed according to the departmental dose-optimization program which includes automated exposure control, adjustment of the mA and/or kV according to patient size and/or use of iterative reconstruction technique. CONTRAST:  OMNIPAQUE IOHEXOL 350 MG/ML SOLN COMPARISON:  Standard CT 02/01/2017 FINDINGS: VASCULAR Aorta: Slight atherosclerotic changes along the aorta. No dissection or aneurysm formation. Celiac: Patent without evidence of aneurysm, dissection, vasculitis or significant stenosis. SMA: Patent without evidence of aneurysm, dissection, vasculitis or significant stenosis. Renals: Both renal arteries are patent without evidence of aneurysm, dissection, vasculitis, fibromuscular dysplasia or significant stenosis. IMA: Patent without evidence of aneurysm, dissection, vasculitis or significant stenosis. Inflow: Patent without evidence of aneurysm, dissection, vasculitis or significant stenosis.  Proximal Outflow: Bilateral common femoral and visualized portions of the superficial and profunda femoral arteries are patent without evidence of aneurysm, dissection, vasculitis or significant stenosis. Veins: No obvious venous abnormality within the limitations of this arterial phase study. Review of the MIP images confirms the above findings. NON-VASCULAR Lower chest: There is some linear opacity seen along bases likely scar or atelectasis. No pleural effusion. Hepatobiliary: Fatty liver infiltration identified. Tiny low-attenuation lesion in segment 6 inferiorly, too small to completely characterize but likely a benign cystic lesion and no specific imaging follow up. Patent portal vein. Gallbladder is nondilated. Pancreas: Unremarkable. No pancreatic ductal dilatation or surrounding inflammatory changes. Spleen: Normal in size without focal abnormality. Adrenals/Urinary Tract: Adrenal glands are preserved. Multiple bilateral parapelvic renal cysts. Mild bilateral renal atrophy. No collecting system dilatation. The ureters have normal course and caliber extending down to the relatively underdistended urinary bladder. Stomach/Bowel: On this non oral contrast exam, the large bowel has a normal course and caliber. Diffuse colonic diverticulosis. Scattered stool. Normal appendix. Stomach is contracted. The small bowel overall is nondilated. There is a segment of small bowel stool appearance in the anterior central pelvis. No areas of pneumatosis, wall thickening or adjacent inflammatory changes. Lymphatic: Aortic atherosclerosis. No enlarged abdominal or pelvic lymph nodes. Reproductive: Heterogeneous  uterus. Possible fibroids. No adnexal mass. Again changes suggestive of some prolapse of fat along the pelvic floor. Other: No free air or free fluid. Small fat containing umbilical hernia. Musculoskeletal: Scattered degenerative changes of the spine and pelvis. IMPRESSION: VASCULAR Mild atherosclerotic changes. No  dissection or aneurysm formation. Central mesenteric vessels are grossly patent. NON-VASCULAR Colonic diverticulosis. No obstruction, free air or free fluid. There is also small segment of small bowel stool appearance in the anterior pelvis without dilatation, inflammatory changes. Bilateral mild renal atrophy with multiple parapelvic renal cysts. Fatty liver infiltration. Electronically Signed   By: Karen Kays M.D.   On: 09/22/2023 13:40        Scheduled Meds:  enoxaparin (LOVENOX) injection  40 mg Subcutaneous Q24H   FLUoxetine  40 mg Oral q morning   mometasone-formoterol  2 puff Inhalation BID   sodium chloride flush  3 mL Intravenous Q12H   Continuous Infusions:  sodium chloride 75 mL/hr at 09/24/23 0854   cefTRIAXone (ROCEPHIN)  IV Stopped (09/23/23 1707)          Glade Lloyd, MD [facility] 09/24/2023, 9:29 AM

## 2023-09-25 DIAGNOSIS — R1030 Lower abdominal pain, unspecified: Secondary | ICD-10-CM | POA: Diagnosis not present

## 2023-09-25 LAB — CBC WITH DIFFERENTIAL/PLATELET
Abs Immature Granulocytes: 0.04 10*3/uL (ref 0.00–0.07)
Basophils Absolute: 0.1 10*3/uL (ref 0.0–0.1)
Basophils Relative: 1 %
Eosinophils Absolute: 0.4 10*3/uL (ref 0.0–0.5)
Eosinophils Relative: 5 %
HCT: 38.9 % (ref 36.0–46.0)
Hemoglobin: 12.8 g/dL (ref 12.0–15.0)
Immature Granulocytes: 0 %
Lymphocytes Relative: 36 %
Lymphs Abs: 3.3 10*3/uL (ref 0.7–4.0)
MCH: 28.9 pg (ref 26.0–34.0)
MCHC: 32.9 g/dL (ref 30.0–36.0)
MCV: 87.8 fL (ref 80.0–100.0)
Monocytes Absolute: 0.7 10*3/uL (ref 0.1–1.0)
Monocytes Relative: 8 %
Neutro Abs: 4.7 10*3/uL (ref 1.7–7.7)
Neutrophils Relative %: 50 %
Platelets: 312 10*3/uL (ref 150–400)
RBC: 4.43 MIL/uL (ref 3.87–5.11)
RDW: 14.6 % (ref 11.5–15.5)
WBC: 9.2 10*3/uL (ref 4.0–10.5)
nRBC: 0 % (ref 0.0–0.2)

## 2023-09-25 LAB — BASIC METABOLIC PANEL
Anion gap: 9 (ref 5–15)
BUN: 10 mg/dL (ref 8–23)
CO2: 26 mmol/L (ref 22–32)
Calcium: 8.6 mg/dL — ABNORMAL LOW (ref 8.9–10.3)
Chloride: 108 mmol/L (ref 98–111)
Creatinine, Ser: 1.02 mg/dL — ABNORMAL HIGH (ref 0.44–1.00)
GFR, Estimated: 55 mL/min — ABNORMAL LOW (ref >60)
Glucose, Bld: 147 mg/dL — ABNORMAL HIGH (ref 70–99)
Potassium: 4.4 mmol/L (ref 3.5–5.1)
Sodium: 143 mmol/L (ref 135–145)

## 2023-09-25 LAB — CULTURE, OB URINE

## 2023-09-25 LAB — MAGNESIUM: Magnesium: 1.8 mg/dL (ref 1.7–2.4)

## 2023-09-25 NOTE — Progress Notes (Signed)
Mobility Specialist Progress Note:    09/25/23 1211  Mobility  Activity Ambulated with assistance in hallway  Level of Assistance Standby assist, set-up cues, supervision of patient - no hands on  Assistive Device None  Distance Ambulated (ft) 500 ft  Activity Response Tolerated well  Mobility Referral Yes  Mobility visit 1 Mobility  Mobility Specialist Start Time (ACUTE ONLY) 1100  Mobility Specialist Stop Time (ACUTE ONLY) 1112  Mobility Specialist Time Calculation (min) (ACUTE ONLY) 12 min   Received pt in chair having no complaints and agreeable to mobility. Pt was asymptomatic throughout ambulation and returned to room w/o fault. Left in chair w/ call bell in reach and all needs met.   Thompson Grayer Mobility Specialist  Please contact vis Secure Chat or  Rehab Office [phone]

## 2023-09-25 NOTE — Progress Notes (Signed)
Physical Therapy Treatment Patient Details Name: Mariah King MRN: [id number] DOB: 05-03-1941 Today's Date: 09/25/2023   History of Present Illness Pt is 83 yo female who presents on 09/22/23 with severe lower abdominal and pelvic pain. Pt had vaginal prolapse with reduction at PCP PTA.  PMH: depression, anxiety, asthma    PT Comments  Pt seen for PT tx with pt agreeable, daughter present in room. Pt is able to ambulate in hallway without AD independently, negotiate stairs with 1 rail & supervision<>mod I, & perform 10x STS without BUE support for BLE strengthening. Pt is mobilizing independently without AD, except supervision to descend stairs. At this time pt has met PT goals. Pt to d/c from acute PT caseload at this time.     If plan is discharge home, recommend the following: Assist for transportation   Can travel by private vehicle        Equipment Recommendations  None recommended by PT    Recommendations for Other Services       Precautions / Restrictions Precautions Precautions: None Restrictions Weight Bearing Restrictions Per Provider Order: No     Mobility  Bed Mobility               General bed mobility comments: not tested, pt received & left sitting in chair    Transfers Overall transfer level: Independent Equipment used: None   Sit to Stand: Independent                Ambulation/Gait Ambulation/Gait assistance: Independent Gait Distance (Feet):  (>150 ft)   Gait Pattern/deviations: Step-through pattern           Stairs Stairs: Yes Stairs assistance: Supervision, Modified independent (Device/Increase time) Stair Management: One rail Right, Step to pattern Number of Stairs: 16 General stair comments: mod I to ascend with 1 rail, supervision to descend with 1 rail   Wheelchair Mobility     Tilt Bed    Modified Rankin (Stroke Patients Only)       Balance                                             Cognition Arousal: Alert Behavior During Therapy: WFL for tasks assessed/performed Overall Cognitive Status: Within Functional Limits for tasks assessed                                          Exercises Other Exercises Other Exercises: 10x STS from standard chair without armrests without use of BUE for BLE strengthening & endurance training    General Comments        Pertinent Vitals/Pain Pain Assessment Pain Assessment: No/denies pain    Home Living Family/patient expects to be discharged to:: Private residence                        Prior Function            PT Goals (current goals can now be found in the care plan section) Acute Rehab PT Goals Patient Stated Goal: return home PT Goal Formulation: With patient/family Time For Goal Achievement: 10/08/23 Potential to Achieve Goals: Good Progress towards PT goals: Goals met/education completed, patient discharged from PT    Frequency    Min 1X/week  PT Plan      Co-evaluation              AM-PAC PT "6 Clicks" Mobility   Outcome Measure  Help needed turning from your back to your side while in a flat bed without using bedrails?: None Help needed moving from lying on your back to sitting on the side of a flat bed without using bedrails?: None Help needed moving to and from a bed to a chair (including a wheelchair)?: None Help needed standing up from a chair using your arms (e.g., wheelchair or bedside chair)?: None Help needed to walk in hospital room?: None Help needed climbing 3-5 steps with a railing? : A Little 6 Click Score: 23    End of Session   Activity Tolerance: Patient tolerated treatment well Patient left: in chair;with nursing/sitter in room         Time: 1610-9604 PT Time Calculation (min) (ACUTE ONLY): 8 min  Charges:    $Therapeutic Activity: 8-22 mins PT General Charges $$ ACUTE PT VISIT: 1 Visit                     Aleda Grana, PT,  DPT 09/25/23, 2:16 PM   [name] 09/25/2023, 2:15 PM

## 2023-09-25 NOTE — Progress Notes (Signed)
PROGRESS NOTE    Mariah King  QVZ:[id number] DOB: 1941/07/01 DOA: 09/23/2023 PCP: Garlan Fillers, MD   Brief Narrative:  83 y.o. female with medical history significant of asthma and depression, vaginal prolapse which was manually reduced in the office recently presented with severe lower abdominal and pelvic pain.  On presentation, WBC was 14.5, creatinine of 1.32, lactic acid was elevated.  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  UA was suggestive of UTI.  She was started on IV fluids, antibiotics and pain medications.  GYN was consulted.  Assessment & Plan:   Severe lower abdominal/pelvic pain Possible urinary tract infection: Present on admission -Recent CT angio of the abdomen and pelvis was negative for any acute abnormality.  Transvaginal ultrasound only showed uterine fibroids.  GYN consulted and and recommended that pain was probably not because of GYN issues: GYN has already signed off.  Outpatient follow-up with GYN if needed -Pain is improving. -Continue Rocephin.  Urine cultures pending: Negative so far.  Leukocytosis -Resolved  Thrombocytosis Resolved  Hyponatremia -Improved  Acute metabolic acidosis Lactic acidosis: Resolved -Resolved  Acute kidney injury -Improved.  Off of IV fluids.  Encourage oral intake.    Diabetes mellitus type 2 -Metformin on hold.  Carb modified diet  Depression -Continue Prozac  Asthma -Stable.  Continue Dulera  DVT prophylaxis: Lovenox Code Status: Full Family Communication: Spoke to daughter on phone on 09/24/2023. Disposition Plan: Status is: inpatient because: Of severity of illness.  Need for IV antibiotics    Consultants: GYN  Procedures: None  Antimicrobials: Rocephin from 09/23/2023 onwards   Subjective: Patient seen and examined at bedside.  Feels slightly better; abdominal pain is improving.  No vomiting, chest  pain, worsening shortness of breath reported.  Objective: Vitals:   09/24/23 1647 09/24/23 1933 09/25/23 0504 09/25/23 0733  BP: 124/68 (!) 119/46 (!) 151/72 (!) 152/77  Pulse: 82 75 66 65  Resp: 18 18 18 20   Temp:  98.1 F (36.7 C) 98.1 F (36.7 C) 97.6 F (36.4 C)  TempSrc:    Oral  SpO2: 94% 95% 98% 100%  Weight:      Height:        Intake/Output Summary (Last 24 hours) at 09/25/2023 0929 Last data filed at 09/25/2023 3295 Gross per 24 hour  Intake 720 ml  Output 0 ml  Net 720 ml   Filed Weights   09/23/23 0656  Weight: 63 kg    Examination:  General: Currently on room air.  No distress.  respiratory: Decreased breath sounds at bases bilaterally with some crackles CVS: Currently rate controlled; S1-S2 heard  abdominal: Soft, nontender, slightly distended, no organomegaly; normal bowel sounds are heard  extremities: Trace lower extremity edema; no clubbing.      Data Reviewed: I have personally reviewed following labs and imaging studies  CBC: Recent Labs  Lab 09/22/23 1028 09/23/23 0804 09/24/23 0315 09/25/23 0613  WBC 13.9* 14.5* 10.9* 9.2  NEUTROABS 7.9* 9.0*  --  4.7  HGB 14.4 14.3 12.3 12.8  HCT 43.5 41.5 38.0 38.9  MCV 86.7 86.3 88.8 87.8  PLT 373 502* 297 312   Basic Metabolic Panel: Recent Labs  Lab 09/22/23 1028 09/23/23 0845 09/24/23 0315 09/25/23 0613  NA 135 134* 139 143  K 4.6 3.8 3.6 4.4  CL 102 100 104 108  CO2 19* 18* 26 26  GLUCOSE 164* 144* 159* 147*  BUN 18 21 14  10  CREATININE 0.87 1.32* 1.05* 1.02*  CALCIUM 9.4 8.6* 8.3* 8.6*  MG  --   --   --  1.8   GFR: Estimated Creatinine Clearance: 36.7 mL/min (A) (by C-G formula based on SCr of 1.02 mg/dL (H)). Liver Function Tests: Recent Labs  Lab 09/22/23 1028 09/23/23 0845 09/24/23 0315  AST 24 23 24   ALT 13 13 14   ALKPHOS 87 78 71  BILITOT 0.6 0.5 0.6  PROT 7.9 6.6 6.0*  ALBUMIN 3.7 3.2* 2.8*   Recent Labs  Lab 09/23/23 0845  LIPASE 37   No results for  input(s): "AMMONIA" in the last 168 hours. Coagulation Profile: No results for input(s): "INR", "PROTIME" in the last 168 hours. Cardiac Enzymes: No results for input(s): "CKTOTAL", "CKMB", "CKMBINDEX", "TROPONINI" in the last 168 hours. BNP (last 3 results) No results for input(s): "PROBNP" in the last 8760 hours. HbA1C: No results for input(s): "HGBA1C" in the last 72 hours. CBG: No results for input(s): "GLUCAP" in the last 168 hours. Lipid Profile: No results for input(s): "CHOL", "HDL", "LDLCALC", "TRIG", "CHOLHDL", "LDLDIRECT" in the last 72 hours. Thyroid Function Tests: No results for input(s): "TSH", "T4TOTAL", "FREET4", "T3FREE", "THYROIDAB" in the last 72 hours. Anemia Panel: No results for input(s): "VITAMINB12", "FOLATE", "FERRITIN", "TIBC", "IRON", "RETICCTPCT" in the last 72 hours. Sepsis Labs: Recent Labs  Lab 09/23/23 0815 09/23/23 1052 09/23/23 1823  LATICACIDVEN 3.0* 2.1* 1.0    Recent Results (from the past 240 hours)  Culture, blood (routine x 2)     Status: None (Preliminary result)   Collection Time: 09/23/23 11:37 AM   Specimen: BLOOD  Result Value Ref Range Status   Specimen Description BLOOD SITE NOT SPECIFIED  Final   Special Requests   Final    BOTTLES DRAWN AEROBIC AND ANAEROBIC Blood Culture adequate volume   Culture   Final    NO GROWTH 1 DAY Performed at [facility], [address], [place], [place] [address]    Report Status PENDING  Incomplete  Culture, blood (routine x 2)     Status: None (Preliminary result)   Collection Time: 09/23/23 11:48 AM   Specimen: BLOOD  Result Value Ref Range Status   Specimen Description BLOOD SITE NOT SPECIFIED  Final   Special Requests   Final    BOTTLES DRAWN AEROBIC AND ANAEROBIC Blood Culture adequate volume   Culture   Final    NO GROWTH 1 DAY Performed at [facility], [address], [place], [place] [address]    Report Status PENDING  Incomplete  Wet prep, genital     Status:  Abnormal   Collection Time: 09/23/23  3:15 PM  Result Value Ref Range Status   Yeast Wet Prep HPF POC NONE SEEN NONE SEEN Final   Trich, Wet Prep NONE SEEN NONE SEEN Final   Clue Cells Wet Prep HPF POC NONE SEEN NONE SEEN Final   WBC, Wet Prep HPF POC >=10 (A) <10 Final   Sperm NONE SEEN  Final    Comment: Performed at [facility], [address], [place], [place] [address]         Radiology Studies: US Pelvis Complete Result Date: 09/23/2023 CLINICAL DATA:  Pelvic pain EXAM: TRANSABDOMINAL AND TRANSVAGINAL ULTRASOUND OF PELVIS TECHNIQUE: Both transabdominal and transvaginal ultrasound examinations of the pelvis were performed. Transabdominal technique was performed for global imaging of the pelvis including uterus, ovaries, adnexal regions, and pelvic cul-de-sac. It was necessary to proceed with  endovaginal exam following the transabdominal exam to visualize the endometrium and adnexa. COMPARISON:  None Available. FINDINGS: Uterus Measurements: 5.5 x 3.3 x 3.9 cm = volume: 37.8 mL. Multiple complex nabothian cysts. There is also a heterogeneous structure seen towards the uterine fundus anteriorly measuring 18 x 15 x 17 mm and adjacent focus measuring 15 x 14 x 16 mm consistent with fibroids. Endometrium Thickness: Obscured by the fibroids in the overlapping soft tissue Right ovary Obscured by bowel gas Left ovary Obscured by bowel gas. Other findings No abnormal free fluid. IMPRESSION: Very limited examination. Poor visualization of the endometrium and ovaries. There are uterine fibroids. If there is further concern of the endometrium and adnexa additional cross-sectional study could be considered as clinically appropriate such as female pelvic MRI. Please correlate with patient's CT angiogram of the abdomen and pelvis from 09/22/2023. Electronically Signed   By: Karen Kays M.D.   On: 09/23/2023 10:25   US Transvaginal Non-OB Result Date: 09/23/2023 CLINICAL DATA:  Pelvic pain EXAM:  TRANSABDOMINAL AND TRANSVAGINAL ULTRASOUND OF PELVIS TECHNIQUE: Both transabdominal and transvaginal ultrasound examinations of the pelvis were performed. Transabdominal technique was performed for global imaging of the pelvis including uterus, ovaries, adnexal regions, and pelvic cul-de-sac. It was necessary to proceed with endovaginal exam following the transabdominal exam to visualize the endometrium and adnexa. COMPARISON:  None Available. FINDINGS: Uterus Measurements: 5.5 x 3.3 x 3.9 cm = volume: 37.8 mL. Multiple complex nabothian cysts. There is also a heterogeneous structure seen towards the uterine fundus anteriorly measuring 18 x 15 x 17 mm and adjacent focus measuring 15 x 14 x 16 mm consistent with fibroids. Endometrium Thickness: Obscured by the fibroids in the overlapping soft tissue Right ovary Obscured by bowel gas Left ovary Obscured by bowel gas. Other findings No abnormal free fluid. IMPRESSION: Very limited examination. Poor visualization of the endometrium and ovaries. There are uterine fibroids. If there is further concern of the endometrium and adnexa additional cross-sectional study could be considered as clinically appropriate such as female pelvic MRI. Please correlate with patient's CT angiogram of the abdomen and pelvis from 09/22/2023. Electronically Signed   By: Karen Kays M.D.   On: 09/23/2023 10:25        Scheduled Meds:  enoxaparin (LOVENOX) injection  40 mg Subcutaneous Q24H   FLUoxetine  40 mg Oral q morning   mometasone-formoterol  2 puff Inhalation BID   pantoprazole (PROTONIX) IV  40 mg Intravenous Q24H   senna-docusate  1 tablet Oral BID   sodium chloride flush  3 mL Intravenous Q12H   Continuous Infusions:  cefTRIAXone (ROCEPHIN)  IV 1 g (09/24/23 1401)          Glade Lloyd, MD [facility] 09/25/2023, 9:29 AM

## 2023-09-25 NOTE — TOC Initial Note (Signed)
Transition of Care [facility]) - Initial/Assessment Note    Patient Details  Name: Mariah King MRN: [id number] Date of Birth: 02-02-1941  [facility]) CM/SW Contact:    Kermit Balo, RN Phone Number: 09/25/2023, 3:48 PM  Clinical Narrative:                  Pt is from home alone. Her sister lives near by and will check on her. Her daughter lives in [place] but will stay with her until Tuesday.  No DME.  Pt drives self. She manages her own medications and denies any issues.  Daughter will transport home when discharged.  Expected Discharge Plan: Home/Self Care Barriers to Discharge: Continued Medical Work up   Patient Goals and CMS Choice            Expected Discharge Plan and Services       Living arrangements for the past 2 months: Single Family Home                                      Prior Living Arrangements/Services Living arrangements for the past 2 months: Single Family Home Lives with:: Self Patient language and need for interpreter reviewed:: Yes Do you feel safe going back to the place where you live?: Yes        Care giver support system in place?: Yes (comment)   Criminal Activity/Legal Involvement Pertinent to Current Situation/Hospitalization: No - Comment as needed  Activities of Daily Living   ADL Screening (condition at time of admission) Independently performs ADLs?: No (at baseline pt is independent) Does the patient have a NEW difficulty with bathing/dressing/toileting/self-feeding that is expected to last >3 days?: Yes (Initiates electronic notice to provider for possible OT consult) Does the patient have a NEW difficulty with getting in/out of bed, walking, or climbing stairs that is expected to last >3 days?: Yes (Initiates electronic notice to provider for possible PT consult) Does the patient have a NEW difficulty with communication that is expected to last >3 days?: No Is the patient deaf or have difficulty hearing?:  Yes Does the patient have difficulty seeing, even when wearing glasses/contacts?: No Does the patient have difficulty concentrating, remembering, or making decisions?: No  Permission Sought/Granted                  Emotional Assessment Appearance:: Appears stated age     Orientation: : Oriented to Self, Oriented to Place, Oriented to  Time, Oriented to Situation   Psych Involvement: No (comment)  Admission diagnosis:  AKI (acute kidney injury) (HCC) [N17.9] Pelvic pain [R10.2] Abdominal pain [R10.9] UTI (urinary tract infection) [N39.0] Patient Active Problem List   Diagnosis Date Noted   UTI (urinary tract infection) 09/23/2023   Leukocytosis 09/23/2023   Lactic acidosis 09/23/2023   Controlled type 2 diabetes mellitus without complication, without long-term current use of insulin (HCC) 09/23/2023   Thrombocytosis 09/23/2023   Cough variant asthma vs uacs 05/04/2018   AKI (acute kidney injury) (HCC) 02/01/2017   Sepsis (HCC) 02/01/2017   Nausea vomiting and diarrhea 02/01/2017   Lower abdominal pain 02/01/2017   Asthma    Depression    PCP:  Garlan Fillers, MD Pharmacy:   [facility] [address], [place] - [address] [place] [address] Phone: [phone] Fax: [phone]     Social Drivers of Health (  SDOH) Social History: SDOH Screenings   Food Insecurity: Patient Unable To Answer (09/23/2023)  Housing: Patient Unable To Answer (09/23/2023)  Transportation Needs: Patient Unable To Answer (09/23/2023)  Utilities: Patient Unable To Answer (09/23/2023)  Social Connections: Patient Unable To Answer (09/23/2023)  Tobacco Use: Medium Risk (09/23/2023)   SDOH Interventions:     Readmission Risk Interventions     No data to display

## 2023-09-26 DIAGNOSIS — R1030 Lower abdominal pain, unspecified: Secondary | ICD-10-CM | POA: Diagnosis not present

## 2023-09-26 MED ORDER — CEPHALEXIN 500 MG PO CAPS: 500.0000 mg | ORAL_CAPSULE | Freq: Three times a day (TID) | ORAL | 0 refills | Status: AC

## 2023-09-26 MED ORDER — POLYETHYLENE GLYCOL 3350 17 G PO PACK: 17.0000 g | PACK | Freq: Every day | ORAL | 0 refills | Status: DC | PRN

## 2023-09-26 NOTE — Plan of Care (Signed)

## 2023-09-26 NOTE — Discharge Summary (Signed)
Physician Discharge Summary  Mariah King:[id number] DOB: 03/02/1941 DOA: 09/23/2023  PCP: Garlan Fillers, MD  Admit date: 09/23/2023 Discharge date: 09/26/2023  Admitted From: Home Disposition: Home  Recommendations for Outpatient Follow-up:  Follow up with PCP in 1 week with repeat CBC/BMP Follow up in ED if symptoms worsen or new appear   Home Health: No Equipment/Devices: None  Discharge Condition: Stable CODE STATUS: Full Diet recommendation: Heart healthy  Brief/Interim Summary: 83 y.o. female with medical history significant of asthma and depression, vaginal prolapse which was manually reduced in the office recently presented with severe lower abdominal and pelvic pain.  On presentation, WBC was 14.5, creatinine of 1.32, lactic acid was elevated.  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  UA was suggestive of UTI.  She was started on IV fluids, antibiotics and pain medications.  GYN was consulted: GYN recommended no GYN intervention and outpatient follow-up with GYN as needed.  Her condition is much improved and she feels much better and wants to go home today.  She will be discharged home today on Keflex for 4 more days.  Discharge Diagnoses:   Severe lower abdominal/pelvic pain Possible urinary tract infection: Present on admission -Recent CT angio of the abdomen and pelvis was negative for any acute abnormality.  Transvaginal ultrasound only showed uterine fibroids.  GYN consulted and and recommended that pain was probably not because of GYN issues: GYN has already signed off.  Outpatient follow-up with GYN if needed -Pain has much improved -Currently on Rocephin.  Urine cultures grew multiple species -Her condition is much improved and she feels much better and wants to go home today.  She will be discharged home today on Keflex for 4 more days.  Leukocytosis -Resolved    Thrombocytosis Resolved   Hyponatremia -Improved   Acute metabolic acidosis Lactic acidosis: Resolved -Resolved   Acute kidney injury -Improved.  Off of IV fluids.  Encourage oral intake.     Diabetes mellitus type 2 -Metformin to be resumed on discharge.  Outpatient follow-up with PCP.  Carb modified diet   Depression -Continue Prozac   Asthma -Stable.  Continue home regimen   Discharge Instructions  Discharge Instructions     Diet - low sodium heart healthy   Complete by: As directed    Increase activity slowly   Complete by: As directed       Allergies as of 09/26/2023       Reactions   Sulfa Antibiotics Other (See Comments), Rash   Childhood, unknown reaction        Medication List     STOP taking these medications    naproxen 375 MG tablet Commonly known as: NAPROSYN       TAKE these medications    acetaminophen 325 MG tablet Commonly known as: TYLENOL Take 650 mg by mouth every 6 (six) hours as needed for mild pain or headache.   Breyna 160-4.5 MCG/ACT inhaler Generic drug: budesonide-formoterol Inhale 1 puff into the lungs 2 (two) times daily.   cephALEXin 500 MG capsule Commonly known as: KEFLEX Take 1 capsule (500 mg total) by mouth 3 (three) times daily for 4 days.   colchicine 0.6 MG tablet TAKE 1 TABLET BY MOUTH TWICE DAILY FOR 3 DAYS AND 1 ONCE DAILY FOR UP TO A WEEK AS NEEDED FOR GOUT   FLUoxetine 40 MG capsule Commonly known as: PROZAC Take 40 mg by mouth every morning.  LORazepam 0.5 MG tablet Commonly known as: ATIVAN Take 0.5 mg by mouth every morning.   metFORMIN 500 MG 24 hr tablet Commonly known as: GLUCOPHAGE-XR Take 500 mg by mouth daily.   mirtazapine 15 MG tablet Commonly known as: REMERON Take 15 mg by mouth at bedtime.   ondansetron 4 MG tablet Commonly known as: ZOFRAN Take 4 mg by mouth once. A DAY FOR 20 DAYS AS NEEDED FOR NAUSEA   oxyCODONE-acetaminophen 5-325 MG tablet Commonly known as:  Percocet Take 1 tablet by mouth every 4 (four) hours as needed for up to 5 days for severe pain (pain score 7-10).   pantoprazole 40 MG tablet Commonly known as: Protonix Take 30- 60 min before your first and last meals of the day   polyethylene glycol 17 g packet Commonly known as: MIRALAX / GLYCOLAX Take 17 g by mouth daily as needed for moderate constipation.   Vitamin D (Ergocalciferol) 1.25 MG (50000 UNIT) Caps capsule Commonly known as: DRISDOL Take 50,000 Units by mouth once a week.        Follow-up Information     Garlan Fillers, MD. Schedule an appointment as soon as possible for a visit in 1 week(s).   Specialty: Internal Medicine Why: with repeat cbc/bmp Contact information: [address] [place] [address] [phone]                Allergies  Allergen Reactions   Sulfa Antibiotics Other (See Comments) and Rash    Childhood, unknown reaction    Consultations: None   Procedures/Studies: US Pelvis Complete Result Date: 09/23/2023 CLINICAL DATA:  Pelvic pain EXAM: TRANSABDOMINAL AND TRANSVAGINAL ULTRASOUND OF PELVIS TECHNIQUE: Both transabdominal and transvaginal ultrasound examinations of the pelvis were performed. Transabdominal technique was performed for global imaging of the pelvis including uterus, ovaries, adnexal regions, and pelvic cul-de-sac. It was necessary to proceed with endovaginal exam following the transabdominal exam to visualize the endometrium and adnexa. COMPARISON:  None Available. FINDINGS: Uterus Measurements: 5.5 x 3.3 x 3.9 cm = volume: 37.8 mL. Multiple complex nabothian cysts. There is also a heterogeneous structure seen towards the uterine fundus anteriorly measuring 18 x 15 x 17 mm and adjacent focus measuring 15 x 14 x 16 mm consistent with fibroids. Endometrium Thickness: Obscured by the fibroids in the overlapping soft tissue Right ovary Obscured by bowel gas Left ovary Obscured by bowel gas. Other findings No  abnormal free fluid. IMPRESSION: Very limited examination. Poor visualization of the endometrium and ovaries. There are uterine fibroids. If there is further concern of the endometrium and adnexa additional cross-sectional study could be considered as clinically appropriate such as female pelvic MRI. Please correlate with patient's CT angiogram of the abdomen and pelvis from 09/22/2023. Electronically Signed   By: Karen Kays M.D.   On: 09/23/2023 10:25   US Transvaginal Non-OB Result Date: 09/23/2023 CLINICAL DATA:  Pelvic pain EXAM: TRANSABDOMINAL AND TRANSVAGINAL ULTRASOUND OF PELVIS TECHNIQUE: Both transabdominal and transvaginal ultrasound examinations of the pelvis were performed. Transabdominal technique was performed for global imaging of the pelvis including uterus, ovaries, adnexal regions, and pelvic cul-de-sac. It was necessary to proceed with endovaginal exam following the transabdominal exam to visualize the endometrium and adnexa. COMPARISON:  None Available. FINDINGS: Uterus Measurements: 5.5 x 3.3 x 3.9 cm = volume: 37.8 mL. Multiple complex nabothian cysts. There is also a heterogeneous structure seen towards the uterine fundus anteriorly measuring 18 x 15 x 17 mm and adjacent focus measuring 15 x 14 x 16  mm consistent with fibroids. Endometrium Thickness: Obscured by the fibroids in the overlapping soft tissue Right ovary Obscured by bowel gas Left ovary Obscured by bowel gas. Other findings No abnormal free fluid. IMPRESSION: Very limited examination. Poor visualization of the endometrium and ovaries. There are uterine fibroids. If there is further concern of the endometrium and adnexa additional cross-sectional study could be considered as clinically appropriate such as female pelvic MRI. Please correlate with patient's CT angiogram of the abdomen and pelvis from 09/22/2023. Electronically Signed   By: Karen Kays M.D.   On: 09/23/2023 10:25   CT Angio Abd/Pel W and/or Wo Contrast Result  Date: 09/22/2023 CLINICAL DATA:  Mesenteric ischemia. Pain. History of vaginal prolapse EXAM: CTA ABDOMEN AND PELVIS WITHOUT AND WITH CONTRAST TECHNIQUE: Multidetector CT imaging of the abdomen and pelvis was performed using the standard protocol during bolus administration of intravenous contrast. Multiplanar reconstructed images and MIPs were obtained and reviewed to evaluate the vascular anatomy. RADIATION DOSE REDUCTION: This exam was performed according to the departmental dose-optimization program which includes automated exposure control, adjustment of the mA and/or kV according to patient size and/or use of iterative reconstruction technique. CONTRAST:  OMNIPAQUE IOHEXOL 350 MG/ML SOLN COMPARISON:  Standard CT 02/01/2017 FINDINGS: VASCULAR Aorta: Slight atherosclerotic changes along the aorta. No dissection or aneurysm formation. Celiac: Patent without evidence of aneurysm, dissection, vasculitis or significant stenosis. SMA: Patent without evidence of aneurysm, dissection, vasculitis or significant stenosis. Renals: Both renal arteries are patent without evidence of aneurysm, dissection, vasculitis, fibromuscular dysplasia or significant stenosis. IMA: Patent without evidence of aneurysm, dissection, vasculitis or significant stenosis. Inflow: Patent without evidence of aneurysm, dissection, vasculitis or significant stenosis. Proximal Outflow: Bilateral common femoral and visualized portions of the superficial and profunda femoral arteries are patent without evidence of aneurysm, dissection, vasculitis or significant stenosis. Veins: No obvious venous abnormality within the limitations of this arterial phase study. Review of the MIP images confirms the above findings. NON-VASCULAR Lower chest: There is some linear opacity seen along bases likely scar or atelectasis. No pleural effusion. Hepatobiliary: Fatty liver infiltration identified. Tiny low-attenuation lesion in segment 6 inferiorly, too small  to completely characterize but likely a benign cystic lesion and no specific imaging follow up. Patent portal vein. Gallbladder is nondilated. Pancreas: Unremarkable. No pancreatic ductal dilatation or surrounding inflammatory changes. Spleen: Normal in size without focal abnormality. Adrenals/Urinary Tract: Adrenal glands are preserved. Multiple bilateral parapelvic renal cysts. Mild bilateral renal atrophy. No collecting system dilatation. The ureters have normal course and caliber extending down to the relatively underdistended urinary bladder. Stomach/Bowel: On this non oral contrast exam, the large bowel has a normal course and caliber. Diffuse colonic diverticulosis. Scattered stool. Normal appendix. Stomach is contracted. The small bowel overall is nondilated. There is a segment of small bowel stool appearance in the anterior central pelvis. No areas of pneumatosis, wall thickening or adjacent inflammatory changes. Lymphatic: Aortic atherosclerosis. No enlarged abdominal or pelvic lymph nodes. Reproductive: Heterogeneous uterus. Possible fibroids. No adnexal mass. Again changes suggestive of some prolapse of fat along the pelvic floor. Other: No free air or free fluid. Small fat containing umbilical hernia. Musculoskeletal: Scattered degenerative changes of the spine and pelvis. IMPRESSION: VASCULAR Mild atherosclerotic changes. No dissection or aneurysm formation. Central mesenteric vessels are grossly patent. NON-VASCULAR Colonic diverticulosis. No obstruction, free air or free fluid. There is also small segment of small bowel stool appearance in the anterior pelvis without dilatation, inflammatory changes. Bilateral mild renal atrophy with multiple parapelvic renal  cysts. Fatty liver infiltration. Electronically Signed   By: Karen Kays M.D.   On: 09/22/2023 13:40      Subjective: Patient seen and examined at bedside.  Feels much better and wants to go home today.  No fever or vomiting or worsening  abdominal pain reported.  Discharge Exam: Vitals:   09/26/23 0440 09/26/23 0736  BP: (!) 147/59 139/72  Pulse: 61 65  Resp: 18 18  Temp: (!) 97.5 F (36.4 C) 97.7 F (36.5 C)  SpO2: 99% 91%    General: Pt is alert, awake, not in acute distress.  On room air. Cardiovascular: rate controlled, S1/S2 + Respiratory: bilateral decreased breath sounds at bases Abdominal: Soft, NT, ND, bowel sounds + Extremities: no edema, no cyanosis    The results of significant diagnostics from this hospitalization (including imaging, microbiology, ancillary and laboratory) are listed below for reference.     Microbiology: Recent Results (from the past 240 hours)  Culture, blood (routine x 2)     Status: None (Preliminary result)   Collection Time: 09/23/23 11:37 AM   Specimen: BLOOD  Result Value Ref Range Status   Specimen Description BLOOD SITE NOT SPECIFIED  Final   Special Requests   Final    BOTTLES DRAWN AEROBIC AND ANAEROBIC Blood Culture adequate volume   Culture   Final    NO GROWTH 3 DAYS Performed at [facility], [address], [place], [place] [address]    Report Status PENDING  Incomplete  Culture, blood (routine x 2)     Status: None (Preliminary result)   Collection Time: 09/23/23 11:48 AM   Specimen: BLOOD  Result Value Ref Range Status   Specimen Description BLOOD SITE NOT SPECIFIED  Final   Special Requests   Final    BOTTLES DRAWN AEROBIC AND ANAEROBIC Blood Culture adequate volume   Culture   Final    NO GROWTH 3 DAYS Performed at [facility], [address], [place], [place] [address]    Report Status PENDING  Incomplete  Culture, OB Urine     Status: Abnormal   Collection Time: 09/23/23  1:56 PM   Specimen: Urine, Random  Result Value Ref Range Status   Specimen Description URINE, RANDOM  Final   Special Requests NONE  Final   Culture (A)  Final    MULTIPLE SPECIES PRESENT, SUGGEST RECOLLECTION NO GROUP B STREP (S.AGALACTIAE)  ISOLATED Performed at [facility], [address] [place], [place] [address]    Report Status 09/25/2023 FINAL  Final  Wet prep, genital     Status: Abnormal   Collection Time: 09/23/23  3:15 PM  Result Value Ref Range Status   Yeast Wet Prep HPF POC NONE SEEN NONE SEEN Final   Trich, Wet Prep NONE SEEN NONE SEEN Final   Clue Cells Wet Prep HPF POC NONE SEEN NONE SEEN Final   WBC, Wet Prep HPF POC >=10 (A) <10 Final   Sperm NONE SEEN  Final    Comment: Performed at [facility], [address], [place], [place] [address]     Labs: BNP (last 3 results) No results for input(s): "BNP" in the last 8760 hours. Basic Metabolic Panel: Recent Labs  Lab 09/22/23 1028 09/23/23 0845 09/24/23 0315 09/25/23 0613  NA 135 134* 139 143  K 4.6 3.8 3.6 4.4  CL 102 100 104 108  CO2 19* 18* 26 26  GLUCOSE 164* 144* 159* 147*  BUN 18 21  14 10  CREATININE 0.87 1.32* 1.05* 1.02*  CALCIUM 9.4 8.6* 8.3* 8.6*  MG  --   --   --  1.8   Liver Function Tests: Recent Labs  Lab 09/22/23 1028 09/23/23 0845 09/24/23 0315  AST 24 23 24   ALT 13 13 14   ALKPHOS 87 78 71  BILITOT 0.6 0.5 0.6  PROT 7.9 6.6 6.0*  ALBUMIN 3.7 3.2* 2.8*   Recent Labs  Lab 09/23/23 0845  LIPASE 37   No results for input(s): "AMMONIA" in the last 168 hours. CBC: Recent Labs  Lab 09/22/23 1028 09/23/23 0804 09/24/23 0315 09/25/23 0613  WBC 13.9* 14.5* 10.9* 9.2  NEUTROABS 7.9* 9.0*  --  4.7  HGB 14.4 14.3 12.3 12.8  HCT 43.5 41.5 38.0 38.9  MCV 86.7 86.3 88.8 87.8  PLT 373 502* 297 312   Cardiac Enzymes: No results for input(s): "CKTOTAL", "CKMB", "CKMBINDEX", "TROPONINI" in the last 168 hours. BNP: Invalid input(s): "POCBNP" CBG: No results for input(s): "GLUCAP" in the last 168 hours. D-Dimer No results for input(s): "DDIMER" in the last 72 hours. Hgb A1c No results for input(s): "HGBA1C" in the last 72 hours. Lipid Profile No results for input(s): "CHOL", "HDL", "LDLCALC", "TRIG",  "CHOLHDL", "LDLDIRECT" in the last 72 hours. Thyroid function studies No results for input(s): "TSH", "T4TOTAL", "T3FREE", "THYROIDAB" in the last 72 hours.  Invalid input(s): "FREET3" Anemia work up No results for input(s): "VITAMINB12", "FOLATE", "FERRITIN", "TIBC", "IRON", "RETICCTPCT" in the last 72 hours. Urinalysis    Component Value Date/Time   COLORURINE YELLOW 09/23/2023 1356   APPEARANCEUR CLEAR 09/23/2023 1356   LABSPEC >1.046 (H) 09/23/2023 1356   PHURINE 5.0 09/23/2023 1356   GLUCOSEU NEGATIVE 09/23/2023 1356   HGBUR NEGATIVE 09/23/2023 1356   BILIRUBINUR NEGATIVE 09/23/2023 1356   KETONESUR 5 (A) 09/23/2023 1356   PROTEINUR NEGATIVE 09/23/2023 1356   NITRITE NEGATIVE 09/23/2023 1356   LEUKOCYTESUR TRACE (A) 09/23/2023 1356   Sepsis Labs Recent Labs  Lab 09/22/23 1028 09/23/23 0804 09/24/23 0315 09/25/23 0613  WBC 13.9* 14.5* 10.9* 9.2   Microbiology Recent Results (from the past 240 hours)  Culture, blood (routine x 2)     Status: None (Preliminary result)   Collection Time: 09/23/23 11:37 AM   Specimen: BLOOD  Result Value Ref Range Status   Specimen Description BLOOD SITE NOT SPECIFIED  Final   Special Requests   Final    BOTTLES DRAWN AEROBIC AND ANAEROBIC Blood Culture adequate volume   Culture   Final    NO GROWTH 3 DAYS Performed at [facility], [address] [place], [place] [address]    Report Status PENDING  Incomplete  Culture, blood (routine x 2)     Status: None (Preliminary result)   Collection Time: 09/23/23 11:48 AM   Specimen: BLOOD  Result Value Ref Range Status   Specimen Description BLOOD SITE NOT SPECIFIED  Final   Special Requests   Final    BOTTLES DRAWN AEROBIC AND ANAEROBIC Blood Culture adequate volume   Culture   Final    NO GROWTH 3 DAYS Performed at [facility], [address], [place], [place] [address]    Report Status PENDING  Incomplete  Culture, OB Urine     Status: Abnormal   Collection Time:  09/23/23  1:56 PM   Specimen: Urine, Random  Result Value Ref Range Status   Specimen Description URINE, RANDOM  Final   Special Requests NONE  Final   Culture (  A)  Final    MULTIPLE SPECIES PRESENT, SUGGEST RECOLLECTION NO GROUP B STREP (S.AGALACTIAE) ISOLATED Performed at [facility], [address] [place], [place] [address]    Report Status 09/25/2023 FINAL  Final  Wet prep, genital     Status: Abnormal   Collection Time: 09/23/23  3:15 PM  Result Value Ref Range Status   Yeast Wet Prep HPF POC NONE SEEN NONE SEEN Final   Trich, Wet Prep NONE SEEN NONE SEEN Final   Clue Cells Wet Prep HPF POC NONE SEEN NONE SEEN Final   WBC, Wet Prep HPF POC >=10 (A) <10 Final   Sperm NONE SEEN  Final    Comment: Performed at [facility], [address] [place], [place] [address]     Time coordinating discharge: 35 minutes  SIGNED:   Glade Lloyd, MD  [facility] 09/26/2023, 9:20 AM

## 2023-09-26 NOTE — Progress Notes (Signed)
DC instructions given and educated pt on new medications. Daughter at bedside. Pt has all of her belongings in her possession.

## 2023-09-28 LAB — CULTURE, BLOOD (ROUTINE X 2)
Culture: NO GROWTH
Culture: NO GROWTH
Special Requests: ADEQUATE
Special Requests: ADEQUATE

## 2023-09-29 ENCOUNTER — Encounter: Payer: Self-pay | Admitting: Obstetrics

## 2023-09-29 ENCOUNTER — Ambulatory Visit: Payer: Medicare Other | Admitting: Obstetrics

## 2023-09-29 VITALS — BP 130/76 | HR 82 | Ht 63.78 in | Wt 135.0 lb

## 2023-09-29 DIAGNOSIS — N952 Postmenopausal atrophic vaginitis: Secondary | ICD-10-CM | POA: Insufficient documentation

## 2023-09-29 DIAGNOSIS — N816 Rectocele: Secondary | ICD-10-CM | POA: Insufficient documentation

## 2023-09-29 DIAGNOSIS — I700 Atherosclerosis of aorta: Secondary | ICD-10-CM | POA: Insufficient documentation

## 2023-09-29 DIAGNOSIS — L659 Nonscarring hair loss, unspecified: Secondary | ICD-10-CM | POA: Insufficient documentation

## 2023-09-29 DIAGNOSIS — R351 Nocturia: Secondary | ICD-10-CM | POA: Insufficient documentation

## 2023-09-29 DIAGNOSIS — N390 Urinary tract infection, site not specified: Secondary | ICD-10-CM

## 2023-09-29 DIAGNOSIS — K5900 Constipation, unspecified: Secondary | ICD-10-CM | POA: Insufficient documentation

## 2023-09-29 DIAGNOSIS — M109 Gout, unspecified: Secondary | ICD-10-CM | POA: Insufficient documentation

## 2023-09-29 LAB — POCT URINALYSIS DIPSTICK
Bilirubin, UA: NEGATIVE
Blood, UA: NEGATIVE
Glucose, UA: NEGATIVE
Ketones, UA: NEGATIVE
Nitrite, UA: NEGATIVE
Protein, UA: NEGATIVE
Spec Grav, UA: 1.02 (ref 1.010–1.025)
Urobilinogen, UA: 0.2 U/dL
pH, UA: 5 (ref 5.0–8.0)

## 2023-09-29 MED ORDER — ESTRADIOL 0.1 MG/GM VA CREA: 0.5000 g | TOPICAL_CREAM | VAGINAL | 3 refills | Status: AC

## 2023-09-29 NOTE — Assessment & Plan Note (Addendum)
-   POCT trace leuk - reviewed urine testing, negative on 1/21 and urine culture with multiple species suggest contamination - denies UTI symptoms at presentation on admission - start vaginal estrogen  - discontinue keflex

## 2023-09-29 NOTE — Assessment & Plan Note (Signed)
For night time frequency: - avoid fluid intake after 6pm

## 2023-09-29 NOTE — Assessment & Plan Note (Signed)
-   bladder scan - For treatment of pelvic organ prolapse, we discussed options for management including expectant management, conservative management, and surgical management, such as Kegels, a pessary, pelvic floor physical therapy, and specific surgical procedures. - exacerbation after tapering anxiolytic and constipation - continue miralax to maintain stool consistency and avoid straining - discussed possible pessary placement to ensure bladder emptying, pt declines at this time due to resolution of symptoms. Monitor for clinical change

## 2023-09-29 NOTE — Assessment & Plan Note (Addendum)
-   For constipation, we reviewed the importance of a better bowel regimen.  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 We discussed initiating therapy with increasing fluid intake, fiber supplementation, stool softeners, and laxatives such as miralax.  - continue miralax or fiber supplementation - avoid straining and encouraged squatting position for relaxation

## 2023-09-29 NOTE — Progress Notes (Signed)
New Patient Evaluation and Consultation  Referring Provider: [name] PCP: Garlan Fillers, MD Date of Service: 09/29/2023  SUBJECTIVE Chief Complaint: New Patient (Initial Visit) Mariah King is a 83 y.o. female here today for female organ prolapse. )  History of Present Illness: Mariah King is a 83 y.o. White or Caucasian female seen in consultation at the request of NP [place] for evaluation of pelvic organ prolapse, fibroids, UTI.    Reports suprapubic pressure started 1 week prior to ED evaluation on 09/22/23 Evaluated by PCP due to urinary frequency, noted thumb size vaginal prolapse on exam and reduced with some relief. Attributed to constipation. Referred to gyn.  UA negative  Presented to ED on 09/22/23 due to 10/10 abdominal pain, CT angio w and w/o contrast with possible fat containing prolapse. Negative TVUS. Discharged with percocet for pain. UA negative and no bladder distention on imaging. Returned to ED on 09/23/23 and admitted for AKI with Cr 1.32 and elevated lactic acid 3. GYN was consulted. Started rocephin for presumed UTI, urine culture with multiple species. Blood cultures negative, UA trace leuk. Negative wet prep. Cr returned to 1.02 and lactic acid 1 prior to discharge on 09/26/23. Discharge with Keflex and miralax  Attributed to discontinuation of Ativan around 1 month ago and anxiety due to concerns with bladder mass at onset of vaginal bulge, states that she was "planning her funeral" Started antibiotics for presumed UTI UA negative 09/21/22 and denies UTI symptoms Started miralax 1 pack daily  History of cervical polypectomy in 1999 and endometrial biopsy in 1999  Review of records significant for: T2DM on metformin, history of sepsis in 2018, essential tremor, COPD  Urinary Symptoms: Does not leak urine.   Day time voids 3.  Nocturia: 3 times per night to void. Denies stopping fluid intake  Denies snoring or LE extremities  Voiding  dysfunction:  empties bladder well.  Patient does not use a catheter to empty bladder.  When urinating, patient feels she has no difficulties Drinks: 12oz water per day, 8oz lemonade   UTIs:  0  UTI's in the last year.   Reports history of kidney or bladder stones No results found for the last 90 days.   Pelvic Organ Prolapse Symptoms:                  Patient Denies a feeling of a bulge the vaginal area.   Bowel Symptom: Bowel movements: 2-3 time(s) per week since childhood Stool consistency: loose Straining: yes.  Splinting: no.  Incomplete evacuation: no.  Patient Denies accidental bowel leakage / fecal incontinence Bowel regimen: miralax Last colonoscopy: none reported [facility] Colonoscopy   This patient has no relevant Health Maintenance data.     Sexual Function Sexually active: no.  Sexual orientation: Straight Pain with sex: No  Pelvic Pain Denies pelvic pain   Past Medical History:  Past Medical History:  Diagnosis Date   Alopecia    Anxiety    Asthma    Constipation    Depression    GERD (gastroesophageal reflux disease)    Gout    Hearing loss    Hyperlipidemia    Hypertension    Prolapse of female pelvic organs    Tremors of nervous system    Uterine fibroid      Past Surgical History:   Past Surgical History:  Procedure Laterality Date   TONSILLECTOMY     TUBAL LIGATION     WISDOM TOOTH EXTRACTION  Past OB/GYN History: OB History  Gravida Para Term Preterm AB Living  2 1 1  1 1   SAB IAB Ectopic Multiple Live Births  1    1    # Outcome Date GA Lbr Len/2nd Weight Sex Type Anes PTL Lv  2 SAB           1 Term     F Vag-Breech   LIV    Vaginal deliveries: 1,6lb6oz  Forceps/ Vacuum deliveries: 0, Cesarean section: 0 Menopausal: Yes, at age 67, Denies vaginal bleeding since menopause Contraception: s/p menopause. Last pap smear was unknown.  Any history of abnormal pap smears: no. No results found for: "DIAGPAP", "HPVHIGH",  "ADEQPAP"  Medications: Patient has a current medication list which includes the following prescription(s): acetaminophen, breyna, cephalexin, colchicine, [START ON 10/01/2023] estradiol, fluoxetine, lorazepam, metformin, mirtazapine, ondansetron, and vitamin d (ergocalciferol).   Allergies: Patient is allergic to sulfa antibiotics.   Social History:  Social History   Tobacco Use   Smoking status: Former    Current packs/day: 0.00    Average packs/day: 0.3 packs/day for 5.0 years (1.3 ttl pk-yrs)    Types: Cigarettes    Start date: 09/01/1953    Quit date: 09/01/1958    Years since quitting: 65.1   Smokeless tobacco: Never  Vaping Use   Vaping status: Never Used  Substance Use Topics   Alcohol use: No   Drug use: No    Relationship status: widowed Patient lives by herself.   Patient is not employed. Regular exercise: No History of abuse: No  Family History:   Family History  Problem Relation Age of Onset   Lung cancer Mother        never smoked   Celiac disease Sister    Bladder Cancer Neg Hx    Uterine cancer Neg Hx      Review of Systems: Review of Systems  Constitutional:  Negative for fever, malaise/fatigue and weight loss.  Respiratory:  Negative for cough, shortness of breath and wheezing.   Cardiovascular:  Negative for chest pain, palpitations and leg swelling.  Gastrointestinal:  Positive for constipation. Negative for abdominal pain and blood in stool.  Genitourinary:  Negative for dysuria, frequency, hematuria and urgency.  Skin:  Negative for rash.  Neurological:  Negative for dizziness, weakness and headaches.  Endo/Heme/Allergies:  Bruises/bleeds easily.  Psychiatric/Behavioral:  Negative for depression. The patient is nervous/anxious.      OBJECTIVE Physical Exam: Vitals:   09/29/23 0924  BP: 130/76  Pulse: 82  Weight: 135 lb (61.2 kg)  Height: 5' 3.78" (1.62 m)   Physical Exam Constitutional:      General: She is not in acute distress.     Appearance: Normal appearance.  Genitourinary:     Bladder and urethral meatus normal.     No lesions in the vagina.     Right Labia: No rash, tenderness, lesions, skin changes or Bartholin's cyst.    Left Labia: No tenderness, lesions, skin changes, Bartholin's cyst or rash.    No vaginal discharge, erythema, tenderness, bleeding, ulceration or granulation tissue.     Posterior vaginal prolapse present.    Moderate vaginal atrophy present.     Right Adnexa: not tender, not full and no mass present.    Left Adnexa: not tender, not full and no mass present.    No cervical motion tenderness, discharge, friability, lesion, polyp or nabothian cyst.     Uterus is prolapsed.     Uterus is not enlarged,  fixed, tender or irregular.     No uterine mass detected.    Urethral meatus caruncle not present.    No urethral prolapse, tenderness, mass, hypermobility, discharge or stress urinary incontinence with cough stress test present.     Bladder is not tender, urgency on palpation not present and masses not present.      Pelvic Floor: Levator muscle strength is 3/5.    Levator ani not tender, obturator internus not tender, no asymmetrical contractions present and no pelvic spasms present.    Symmetrical pelvic sensation, anal wink present and BC reflex present. Cardiovascular:     Rate and Rhythm: Normal rate.  Pulmonary:     Effort: Pulmonary effort is normal. No respiratory distress.  Abdominal:     General: There is no distension.     Palpations: Abdomen is soft. There is no mass.     Tenderness: There is no abdominal tenderness.     Hernia: No hernia is present.    Neurological:     Mental Status: She is alert.  Vitals reviewed. Exam conducted with a chaperone present.      POP-Q:   POP-Q  -1                                            Aa   -1                                           Ba  -4                                              C   1                                             Gh  2                                            Pb  6                                            tvl   1                                            Ap  1                                            Bp  -4  D     Post-Void Residual (PVR) by Bladder Scan: In order to evaluate bladder emptying, we discussed obtaining a postvoid residual and patient agreed to this procedure.  Procedure: The ultrasound unit was placed on the patient's abdomen in the suprapubic region after the patient had voided.    Post Void Residual - 09/29/23 0942       Post Void Residual   Post Void Residual 150 mL              Laboratory Results: Lab Results  Component Value Date   COLORU yellow 09/29/2023   CLARITYU clear 09/29/2023   GLUCOSEUR Negative 09/29/2023   BILIRUBINUR Negative 09/29/2023   KETONESU Negative 09/29/2023   SPECGRAV 1.020 09/29/2023   RBCUR Negative 09/29/2023   PHUR 5.0 09/29/2023   PROTEINUR Negative 09/29/2023   UROBILINOGEN 0.2 09/29/2023   LEUKOCYTESUR Trace (A) 09/29/2023    Lab Results  Component Value Date   CREATININE 1.02 (H) 09/25/2023   CREATININE 1.05 (H) 09/24/2023   CREATININE 1.32 (H) 09/23/2023    Lab Results  Component Value Date   HGBA1C 7.0 (H) 02/03/2017    Lab Results  Component Value Date   HGB 12.8 09/25/2023   CT angio abd/pel w and w/o contrast 09/22/23:  "Reproductive: Heterogeneous uterus. Possible fibroids. No adnexal mass. Again changes suggestive of some prolapse of fat along the pelvic floor."  "IMPRESSION: VASCULAR   Mild atherosclerotic changes. No dissection or aneurysm formation. Central mesenteric vessels are grossly patent.   NON-VASCULAR   Colonic diverticulosis. No obstruction, free air or free fluid. There is also small segment of small bowel stool appearance in the anterior pelvis without dilatation, inflammatory changes.   Bilateral mild renal atrophy with  multiple parapelvic renal cysts.   Fatty liver infiltration.    Electronically Signed   By: Karen Kays M.D.   On: 09/22/2023 13:40"  TVUS 09/23/23: "FINDINGS: Uterus   Measurements: 5.5 x 3.3 x 3.9 cm = volume: 37.8 mL. Multiple complex nabothian cysts. There is also a heterogeneous structure seen towards the uterine fundus anteriorly measuring 18 x 15 x 17 mm and adjacent focus measuring 15 x 14 x 16 mm consistent with fibroids.   Endometrium   Thickness: Obscured by the fibroids in the overlapping soft tissue   Right ovary   Obscured by bowel gas   Left ovary   Obscured by bowel gas.   Other findings   No abnormal free fluid.   IMPRESSION: Very limited examination. Poor visualization of the endometrium and ovaries. There are uterine fibroids. If there is further concern of the endometrium and adnexa additional cross-sectional study could be considered as clinically appropriate such as female pelvic MRI. Please correlate with patient's CT angiogram of the abdomen and pelvis from 09/22/2023.   Electronically Signed   By: Karen Kays M.D.   On: 09/23/2023 10:25"  ASSESSMENT AND PLAN Ms. Franey is a 83 y.o. with:  1. Nocturia   2. Constipation, unspecified constipation type   3. 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 Vaginal atrophy   5. Urinary tract infection without hematuria, site unspecified     Nocturia Assessment & Plan: For night time frequency: - avoid fluid intake after 6pm  Orders: -     POCT urinalysis dipstick  Constipation, unspecified constipation type Assessment & Plan: - For constipation, we reviewed the importance of a better bowel regimen.  We also discussed the importance of avoiding chronic straining, as it can exacerbate  her pelvic floor symptoms; we discussed treating constipation and straining prior to surgery, as postoperative straining can lead to damage to the repair and recurrence of symptoms. We discussed  initiating therapy with increasing fluid intake, fiber supplementation, stool softeners, and laxatives such as miralax.  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 - exacerbation after tapering anxiolytic and constipation - continue miralax to maintain stool consistency and avoid straining - discussed possible pessary placement to ensure bladder emptying, pt declines at this time due to resolution of symptoms. Monitor for clinical change  Orders: -     Estradiol; Place 0.5 g vaginally 2 (two) times a week. Place 0.5g nightly for two weeks then twice a week after  Dispense: 30 g; Refill: 3  Vaginal atrophy Assessment & Plan: - For symptomatic vaginal atrophy options include lubrication with a water-based lubricant, personal hygiene measures and barrier protection against wetness, and estrogen replacement in the form of vaginal cream, vaginal tablets, or a time-released vaginal ring.   - start vaginal estrogen  Orders: -     Estradiol; Place 0.5 g vaginally 2 (two) times a week. Place 0.5g nightly for two weeks then twice a week after  Dispense: 30 g; Refill: 3  Urinary tract infection without hematuria, site unspecified Assessment & Plan: - POCT trace leuk - reviewed urine testing, negative on 1/21 and urine culture with multiple species suggest contamination - denies UTI symptoms at presentation on admission - start vaginal estrogen  - discontinue keflex    Time spent: I spent 83 minutes dedicated to the care of this patient on the date of this encounter to include  pre-visit review of records, face-to-face time with the patient discussing stage II pelvic organ prolapse, constipation, vaginal atrophy, history of UTI, nocturia, and post visit documentation and ordering medication/ testing.    Loleta Chance, MD

## 2023-09-29 NOTE — Assessment & Plan Note (Signed)
-   For symptomatic vaginal atrophy options include lubrication with a water-based lubricant, personal hygiene measures and barrier protection against wetness, and estrogen replacement in the form of vaginal cream, vaginal tablets, or a time-released vaginal ring.   - start vaginal estrogen

## 2023-09-29 NOTE — Patient Instructions (Addendum)
You have a stage 2 (out of 4) prolapse.  We discussed the fact that it is not life threatening but there are several treatment options. For treatment of pelvic organ prolapse, we discussed options for management including expectant management, conservative management, and surgical management, such as Kegels, a pessary, pelvic floor physical therapy, and specific surgical procedures.     For night time frequency: - avoid fluid intake after 6pm  Constipation: Our goal is to achieve formed bowel movements daily or every-other-day.  You may need to try different combinations of the following options to find what works best for you - everybody's body works differently so feel free to adjust the dosages as needed.  Some options to help maintain bowel health include:  Dietary changes (more leafy greens, vegetables and fruits; less processed foods) Fiber supplementation (Benefiber, FiberCon, Metamucil or Psyllium). Start slow and increase gradually to full dose. Over-the-counter agents such as: stool softeners (Docusate or Colace) and/or laxatives (Miralax, milk of magnesia)  "Power Pudding" is a natural mixture that may help your constipation.  To make blend 1 cup applesauce, 1 cup wheat bran, and 3/4 cup prune juice, refrigerate and then take 1 tablespoon daily with a large glass of water as needed.   Women should try to eat at least 21 to 25 grams of fiber a day, while men should aim for 30 to 38 grams a day. You can add fiber to your diet with food or a fiber supplement such as psyllium (metamucil), benefiber, or fibercon.   Here's a look at how much dietary fiber is found in some common foods. When buying packaged foods, check the Nutrition Facts label for fiber content. It can vary among brands.  Fruits Serving size Total fiber (grams)*  Raspberries 1 cup 8.0  Pear 1 medium 5.5  Apple, with skin 1 medium 4.5  Banana 1 medium 3.0  Orange 1 medium 3.0  Strawberries 1 cup 3.0   Vegetables Serving  size Total fiber (grams)*  Green peas, boiled 1 cup 9.0  Broccoli, boiled 1 cup chopped 5.0  Turnip greens, boiled 1 cup 5.0  Brussels sprouts, boiled 1 cup 4.0  Potato, with skin, baked 1 medium 4.0  Sweet corn, boiled 1 cup 3.5  Cauliflower, raw 1 cup chopped 2.0  Carrot, raw 1 medium 1.5   Grains Serving size Total fiber (grams)*  Spaghetti, whole-wheat, cooked 1 cup 6.0  Barley, pearled, cooked 1 cup 6.0  Bran flakes 3/4 cup 5.5  Quinoa, cooked 1 cup 5.0  Oat bran muffin 1 medium 5.0  Oatmeal, instant, cooked 1 cup 5.0  Popcorn, air-popped 3 cups 3.5  Brown rice, cooked 1 cup 3.5  Bread, whole-wheat 1 slice 2.0  Bread, rye 1 slice 2.0   Legumes, nuts and seeds Serving size Total fiber (grams)*  Split peas, boiled 1 cup 16.0  Lentils, boiled 1 cup 15.5  Black beans, boiled 1 cup 15.0  Baked beans, canned 1 cup 10.0  Chia seeds 1 ounce 10.0  Almonds 1 ounce (23 nuts) 3.5  Pistachios 1 ounce (49 nuts) 3.0  Sunflower kernels 1 ounce 3.0  *Rounded to nearest 0.5 gram. Source: [employer] for [employer], Legacy Release    For symptomatic vaginal atrophy options include lubrication with a water-based lubricant, personal hygiene measures and barrier protection against wetness, and estrogen replacement in the form of vaginal cream, vaginal tablets, or a time-released vaginal ring.     For vaginal atrophy (thinning of the vaginal tissue  that can cause dryness and burning) and UTI prevention we discussed estrogen replacement in the form of vaginal cream.   Start vaginal estrogen therapy nightly for two weeks then 2 times weekly at night. This can be placed with your finger or an applicator inside the vagina and around the urethra.  Please let us know if the prescription is too expensive and we can look for alternative options.   Is vaginal estrogen therapy safe for me? Vaginal estrogen preparations act on the vaginal skin, and only a very tiny amount is  absorbed into the bloodstream (0.01%).  They work in a similar way to hand or face cream.  There is minimal absorption and they are therefore perfectly safe. If you have had breast cancer and have persistent troublesome symptoms which aren't settling with vaginal moisturisers and lubricants, local estrogen treatment may be a possibility, but consultation with your oncologist should take place first.

## 2023-10-06 DIAGNOSIS — N816 Rectocele: Secondary | ICD-10-CM | POA: Diagnosis not present

## 2023-10-06 DIAGNOSIS — E119 Type 2 diabetes mellitus without complications: Secondary | ICD-10-CM | POA: Diagnosis not present

## 2023-12-03 DIAGNOSIS — E785 Hyperlipidemia, unspecified: Secondary | ICD-10-CM | POA: Diagnosis not present

## 2023-12-03 DIAGNOSIS — M109 Gout, unspecified: Secondary | ICD-10-CM | POA: Diagnosis not present

## 2023-12-03 DIAGNOSIS — M810 Age-related osteoporosis without current pathological fracture: Secondary | ICD-10-CM | POA: Diagnosis not present

## 2023-12-03 DIAGNOSIS — E119 Type 2 diabetes mellitus without complications: Secondary | ICD-10-CM | POA: Diagnosis not present

## 2023-12-10 DIAGNOSIS — Z1331 Encounter for screening for depression: Secondary | ICD-10-CM | POA: Diagnosis not present

## 2023-12-10 DIAGNOSIS — Z1339 Encounter for screening examination for other mental health and behavioral disorders: Secondary | ICD-10-CM | POA: Diagnosis not present

## 2023-12-10 DIAGNOSIS — E119 Type 2 diabetes mellitus without complications: Secondary | ICD-10-CM | POA: Diagnosis not present

## 2023-12-10 DIAGNOSIS — R82998 Other abnormal findings in urine: Secondary | ICD-10-CM | POA: Diagnosis not present

## 2023-12-10 DIAGNOSIS — Z0000 Encounter for general adult medical examination without abnormal findings: Secondary | ICD-10-CM | POA: Diagnosis not present

## 2023-12-28 ENCOUNTER — Telehealth: Payer: Medicare Other | Admitting: Obstetrics

## 2023-12-28 DIAGNOSIS — N816 Rectocele: Secondary | ICD-10-CM

## 2023-12-28 DIAGNOSIS — K5900 Constipation, unspecified: Secondary | ICD-10-CM

## 2023-12-28 DIAGNOSIS — N952 Postmenopausal atrophic vaginitis: Secondary | ICD-10-CM | POA: Diagnosis not present

## 2023-12-28 DIAGNOSIS — N390 Urinary tract infection, site not specified: Secondary | ICD-10-CM

## 2023-12-28 NOTE — Assessment & Plan Note (Signed)
-   prior bladder scan , reports improved sensation of bladder and bowel emptying with reduction of bulge symptoms since starting miralax  and vaginal estrogen - For treatment of pelvic organ prolapse, we discussed options for management including expectant management, conservative management, and surgical management, such as Kegels, a pessary, pelvic floor physical therapy, and specific surgical procedures. - exacerbation after tapering anxiolytic and constipation - continue miralax  to maintain stool consistency and avoid straining - encouraged pt to monitor for clinical change and return to the office for repeat evaluation if symptoms return

## 2023-12-28 NOTE — Assessment & Plan Note (Signed)
-   POCT trace leuk 09/29/23 - reviewed urine testing, negative on 1/21 and urine culture with multiple species suggest contamination - denies UTI symptoms at presentation on admission or since prior visit - continue vaginal estrogen 1g twice a week - discontinued keflex

## 2023-12-28 NOTE — Progress Notes (Signed)
 [facility] Return Visit  SUBJECTIVE  History of Present Illness: Mariah King is a 83 y.o. female seen in follow-up for stage II pelvic organ prolapse, constipation, nocturia, UTI and vaginal atrophy. Plan at last visit was continue fiber supplementation, start vaginal estrogen.   Patient location: home in [place] Provider location: Program[place], applications for [facility]  Patient consents to video call and understands the limitations of telehealth visit.  Reports improvement in bladder and bowel symptoms and pleased with symptomatic relief. Started vaginal estrogen with relief and reduction of bulge symptoms. Started miralax  daily with loose stool every 2 days.   Denies UTIs symptoms.   Past Medical History: Patient  has a past medical history of Alopecia, Anxiety, Asthma, Constipation, Depression, GERD (gastroesophageal reflux disease), Gout, Hearing loss, Hyperlipidemia, Hypertension, Prolapse of female pelvic organs, Tremors of nervous system, and Uterine fibroid.   Past Surgical History: She  has a past surgical history that includes Tubal ligation; Tonsillectomy; and Wisdom tooth extraction.   Medications: She has a current medication list which includes the following prescription(s): acetaminophen , breyna, colchicine, estradiol , fluoxetine , lorazepam , metformin, mirtazapine , ondansetron , and vitamin d (ergocalciferol).   Allergies: Patient is allergic to sulfa antibiotics.   Social History: Patient  reports that she quit smoking about 65 years ago. Her smoking use included cigarettes. She started smoking about 70 years ago. She has a 1.3 pack-year smoking history. She has never used smokeless tobacco. She reports that she does not drink alcohol and does not use drugs.     OBJECTIVE     Physical Exam: Gen: No apparent distress, A&O x 3.  Detailed Urogynecologic Evaluation:  Deferred. Prior exam showed:      No data to display              ASSESSMENT AND PLAN    Mariah King is a 83 y.o. with:  1. Constipation, unspecified constipation type   2. 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 Vaginal atrophy   4. Urinary tract infection without hematuria, site unspecified     Constipation, unspecified constipation type Assessment & Plan: - reports improved consistently with BM every 2 days, however intermittent loose stool with miralax  1 packet daily - encouraged reduction in miralax  to optimize stool consistency for Bristol III-V stool - For constipation, we reviewed the importance of a better bowel regimen.  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 We discussed initiating therapy with increasing fluid intake, fiber supplementation, stool softeners, and laxatives such as miralax .  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 - exacerbation after tapering anxiolytic and constipation - continue miralax  to maintain stool consistency and avoid straining - encouraged pt to monitor for clinical change and return to the office for repeat evaluation if symptoms return   Vaginal atrophy Assessment & Plan: - For symptomatic vaginal atrophy options include lubrication with a  water-based lubricant, personal hygiene measures and barrier protection against wetness, and estrogen replacement in the form of vaginal cream, vaginal tablets, or a time-released vaginal ring.   - encouraged to continue vaginal estrogen   Urinary tract infection without hematuria, site unspecified Assessment & Plan: - POCT trace leuk 09/29/23 - reviewed urine testing, negative on 1/21 and urine culture with multiple species suggest contamination - denies UTI symptoms at presentation on admission or since prior visit - continue vaginal estrogen 1g twice a week - discontinued keflex    Time spent: I spent 10 minutes dedicated to the care of this patient on the date of this encounter to include pre-visit review of records, face-to-face time with the patient discussing stage II pelvic organ prolapse, vaginal atrophy, constipation, lower urinary tract symptoms, and post visit documentation.    Darlene Ehlers, MD

## 2023-12-28 NOTE — Assessment & Plan Note (Signed)
-   For symptomatic vaginal atrophy options include lubrication with a water-based lubricant, personal hygiene measures and barrier protection against wetness, and estrogen replacement in the form of vaginal cream, vaginal tablets, or a time-released vaginal ring.   - encouraged to continue vaginal estrogen

## 2023-12-28 NOTE — Assessment & Plan Note (Signed)
-   reports improved consistently with BM every 2 days, however intermittent loose stool with miralax  1 packet daily - encouraged reduction in miralax  to optimize stool consistency for [place] III-V stool - For constipation, we reviewed the importance of a better bowel regimen.  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 We discussed initiating therapy with increasing fluid intake, fiber supplementation, stool softeners, and laxatives such as miralax .  - continue to avoid straining and encouraged squatting position for relaxation

## 2024-05-30 DIAGNOSIS — H524 Presbyopia: Secondary | ICD-10-CM | POA: Diagnosis not present

## 2024-06-13 DIAGNOSIS — E119 Type 2 diabetes mellitus without complications: Secondary | ICD-10-CM | POA: Diagnosis not present
